# Patient Record
Sex: Female | Born: 1945
Health system: Southern US, Community
[De-identification: ages and names within clinical notes are randomized; demographics above are authoritative.]

## PROBLEM LIST (undated history)

## (undated) DIAGNOSIS — C801 Malignant (primary) neoplasm, unspecified: Secondary | ICD-10-CM

## (undated) DIAGNOSIS — F419 Anxiety disorder, unspecified: Secondary | ICD-10-CM

## (undated) DIAGNOSIS — F32A Depression, unspecified: Secondary | ICD-10-CM

## (undated) HISTORY — PX: CHOLECYSTECTOMY: SHX55

## (undated) HISTORY — PX: BREAST SURGERY: SHX581

## (undated) HISTORY — PX: ABDOMINAL HYSTERECTOMY: SHX81

## (undated) HISTORY — PX: HERNIA REPAIR: SHX51

## (undated) HISTORY — PX: APPENDECTOMY: SHX54

## (undated) HISTORY — DX: Malignant (primary) neoplasm, unspecified: C80.1

---

## 2005-03-01 ENCOUNTER — Encounter: Admission: RE | Admit: 2005-03-01 | Discharge: 2005-03-01 | Payer: Self-pay | Admitting: General Surgery

## 2005-04-09 ENCOUNTER — Ambulatory Visit (HOSPITAL_COMMUNITY): Admission: RE | Admit: 2005-04-09 | Discharge: 2005-04-09 | Payer: Self-pay | Admitting: Oncology

## 2005-04-09 ENCOUNTER — Ambulatory Visit: Payer: Self-pay | Admitting: Cardiology

## 2005-06-26 ENCOUNTER — Ambulatory Visit: Admission: RE | Admit: 2005-06-26 | Discharge: 2005-09-24 | Payer: Self-pay | Admitting: *Deleted

## 2006-11-12 ENCOUNTER — Encounter: Admission: RE | Admit: 2006-11-12 | Discharge: 2006-11-12 | Payer: Self-pay | Admitting: Hematology and Oncology

## 2007-05-06 ENCOUNTER — Encounter (INDEPENDENT_AMBULATORY_CARE_PROVIDER_SITE_OTHER): Payer: Self-pay | Admitting: Radiology

## 2007-05-06 ENCOUNTER — Encounter: Admission: RE | Admit: 2007-05-06 | Discharge: 2007-05-06 | Payer: Self-pay | Admitting: General Surgery

## 2008-04-27 ENCOUNTER — Encounter: Admission: RE | Admit: 2008-04-27 | Discharge: 2008-04-27 | Payer: Self-pay | Admitting: Internal Medicine

## 2008-08-27 HISTORY — PX: CERVICAL DISC SURGERY: SHX588

## 2009-02-04 ENCOUNTER — Encounter: Admission: RE | Admit: 2009-02-04 | Discharge: 2009-02-04 | Payer: Self-pay | Admitting: Neurosurgery

## 2009-04-27 ENCOUNTER — Ambulatory Visit (HOSPITAL_COMMUNITY): Admission: RE | Admit: 2009-04-27 | Discharge: 2009-04-28 | Payer: Self-pay | Admitting: Neurosurgery

## 2009-05-18 ENCOUNTER — Encounter: Admission: RE | Admit: 2009-05-18 | Discharge: 2009-05-18 | Payer: Self-pay | Admitting: Internal Medicine

## 2010-09-17 ENCOUNTER — Encounter (HOSPITAL_COMMUNITY): Payer: Self-pay | Admitting: Internal Medicine

## 2010-12-02 LAB — CBC
HCT: 43.1 % (ref 36.0–46.0)
Hemoglobin: 14.7 g/dL (ref 12.0–15.0)
MCHC: 34 g/dL (ref 30.0–36.0)
MCV: 86.1 fL (ref 78.0–100.0)
Platelets: 249 10*3/uL (ref 150–400)
RBC: 5.01 MIL/uL (ref 3.87–5.11)
RDW: 14.2 % (ref 11.5–15.5)
WBC: 4.2 10*3/uL (ref 4.0–10.5)

## 2011-01-09 NOTE — Op Note (Signed)
NAMEVIANNE, GRIESHOP                  ACCOUNT NO.:  [id number]   MEDICAL RECORD NO.:  [id number]          PATIENT TYPE:  OIB   LOCATION:  3534                         FACILITY:  [facility]   PHYSICIAN:  Hewitt Shorts, M.D.DATE OF BIRTH:  1946/03/03   DATE OF PROCEDURE:  04/27/2009  DATE OF DISCHARGE:                               OPERATIVE REPORT   PREOPERATIVE DIAGNOSES:  Cervical spondylitic disk herniation, C5-C6 and  C6-C7.  Cervical degenerative disk disease and cervical radiculopathy.   POSTOPERATIVE DIAGNOSES:  Cervical spondylitic disk herniation, C5-C6  and C6-C7.  Cervical degenerative disk disease and cervical  radiculopathy.   PROCEDURE:  C5-C6 and C6-C7 anterior cervical decompression and  arthrodesis with allograft and Tether cervical plating.   SURGEON:  Hewitt Shorts, MD   ASSISTANTS:  1. Russell L. Webb Silversmith, NP  2. Stefani Dama, MD   ANESTHESIA:  General endotracheal.   INDICATIONS:  The patient is a 65 year old woman who presented with left  cervical radiculopathy.  She had spondylitic disk herniations to the  left at both C5-C6 and C6-C7 and presented with left cervical  radiculopathy.  A decision was made to proceed with decompression and  arthrodesis.   PROCEDURE IN DETAIL:  The patient was brought to the operating room and  placed under general endotracheal anesthesia.  The patient was placed in  10 pounds of Halter traction.  The neck was prepped with Betadine soap  and solution, and draped in sterile fashion.  A horizontal incision was  made on the left side of the neck.  The line of the incision was  infiltrated with local anesthetic with epinephrine.  Dissection was  carried down through the subcutaneous tissue and platysma.  Bipolar  cautery was used to maintain hemostasis.  Dissection was then carried  out through the avascular plane  leaving the sternocleidomastoid,  carotid artery, and jugular vein laterally and the trachea and esophagus  medially.  The ventral aspect of the vertebral column was identified, a  localized x-ray taken, and the C5-C6 and C6-C7 intervertebral disk space  was identified.  The operating microscope was draped and brought into  the field to provide magnification, illumination, and visualization and  decompression performed using microdissection and microsurgical  technique.  The annulus was incised at each level and anterior  spondylitic overgrowth was removed using Kerrison punches and the  osteophyte removed.  We entered into the disk space and proceeded with  thorough diskectomy removing all loose fragments of disk material.  The  cartilaginous endplates were removed using microcurettes along with the  high-speed drill and then dissection was carried out posteriorly through  the disk space removing posterior osteophytic overgrowth at each level.  We then carefully removed the posterior longitudinal ligament and  decompressed the spinal canal and thecal sac at each level.  We then  turned our attention to the neural foramen.  There was notable  spondylitic disk herniation particularly through her left side  encroaching on the neural foramen and exiting nerve roots.  This was  carefully removed with a 2-mm Kerrison punch decompressing the  exiting  nerve roots.  This was done bilaterally and once decompression was  completed, hemostasis established with use of Gelfoam soaked in thrombin  and then we proceeded with the arthrodesis.   The intervertebral disk space was sized and we selected two 6-mm  allograft implants.  They were hydrated in saline solution and  positioned in the intervertebral space and countersunk.  Once both  grafts were in place, we selected 29-mm Tether cervical plate.  It was  positioned over the fusion construct and secured to the vertebra with 4  x 14 mm variable angle screws, placing a pair screws at C5, another pair  at C7, and a single screw at C6.  Each screw hole was  drilled and the  screws were placed in the alternating fashion.  Once all 5 screws were  in place, the final tightening was performed.  The wound was irrigated  with bacitracin solution, checked for hemostasis which was established  and confirmed.  An x-ray was taken which showed the plate, grafts, and  screws were all in good position.  The alignment was good and then once  hemostasis was reconfirmed, we proceeded with closure.  The platysma was  closed with interrupted inverted 2-0 undyed Vicryl sutures.  Subcutaneous and subcuticular were closed with interrupted inverted 3-0  undyed Vicryl sutures.  The skin was reapproximated with Dermabond.  The  procedure was tolerated well.  The estimated blood loss was 20 mL.  Sponge and needle count was correct.  Following surgery, the patient was  taken out of traction, reversed from the anesthetic, and placed in a  soft cervical collar to be extubated and transferred to the recovery  room for further care.      Hewitt Shorts, M.D.  Electronically Signed     RWN/MEDQ  D:  04/27/2009  T:  04/28/2009  Job:  [id number]

## 2011-02-13 ENCOUNTER — Other Ambulatory Visit: Payer: Self-pay | Admitting: Gastroenterology

## 2011-02-27 ENCOUNTER — Telehealth (INDEPENDENT_AMBULATORY_CARE_PROVIDER_SITE_OTHER): Payer: Self-pay | Admitting: Surgery

## 2011-03-05 ENCOUNTER — Encounter (INDEPENDENT_AMBULATORY_CARE_PROVIDER_SITE_OTHER): Payer: Self-pay | Admitting: Surgery

## 2011-03-08 ENCOUNTER — Encounter (INDEPENDENT_AMBULATORY_CARE_PROVIDER_SITE_OTHER): Payer: Self-pay | Admitting: Surgery

## 2011-03-13 ENCOUNTER — Ambulatory Visit (INDEPENDENT_AMBULATORY_CARE_PROVIDER_SITE_OTHER): Payer: Medicare Other | Admitting: Surgery

## 2011-03-13 ENCOUNTER — Encounter (INDEPENDENT_AMBULATORY_CARE_PROVIDER_SITE_OTHER): Payer: Self-pay | Admitting: Surgery

## 2011-03-13 DIAGNOSIS — K439 Ventral hernia without obstruction or gangrene: Secondary | ICD-10-CM

## 2011-03-13 NOTE — Progress Notes (Signed)
The patient returns after her recent colonoscopy by Dr. Dulce Sellar. He found some large diverticula in the descending and sigmoid colon. A small benign polyp was found in the descending colon. Otherwise the colon was normal. EGD was also unremarkable. He found no explanation for her abdominal pain other than the ventral hernias. She returns today to discuss ventral hernia repair.  The patient has multiple Swiss cheese type defects in her anterior abdominal wall. We will plan a laparoscopic ventral hernia repair with mesh.  The surgical procedure has been discussed with the patient.  Potential risks, benefits, alternative treatments, and expected outcomes have been explained.  All of the patient's questions at this time have been answered.  The patient understand the proposed surgical procedure and wishes to proceed. We will schedule this at the next available date.

## 2011-03-13 NOTE — Patient Instructions (Signed)
Schedule surgery today. 

## 2011-03-16 ENCOUNTER — Encounter (HOSPITAL_COMMUNITY): Payer: Medicare Other

## 2011-03-16 ENCOUNTER — Other Ambulatory Visit (INDEPENDENT_AMBULATORY_CARE_PROVIDER_SITE_OTHER): Payer: Self-pay | Admitting: Surgery

## 2011-03-16 ENCOUNTER — Other Ambulatory Visit: Payer: Self-pay | Admitting: Anesthesiology

## 2011-03-16 LAB — CBC
HCT: 45.2 % (ref 36.0–46.0)
Hemoglobin: 15 g/dL (ref 12.0–15.0)
MCH: 28.5 pg (ref 26.0–34.0)
MCHC: 33.2 g/dL (ref 30.0–36.0)
MCV: 85.9 fL (ref 78.0–100.0)
Platelets: 233 10*3/uL (ref 150–400)
RBC: 5.26 MIL/uL — ABNORMAL HIGH (ref 3.87–5.11)
RDW: 13.6 % (ref 11.5–15.5)
WBC: 4.4 10*3/uL (ref 4.0–10.5)

## 2011-03-16 LAB — SURGICAL PCR SCREEN
MRSA, PCR: NEGATIVE
Staphylococcus aureus: NEGATIVE

## 2011-03-23 ENCOUNTER — Ambulatory Visit (HOSPITAL_COMMUNITY)
Admission: RE | Admit: 2011-03-23 | Discharge: 2011-03-24 | Disposition: A | Discharge status: Home / Self Care | Payer: Medicare Other | Source: Ambulatory Visit | Attending: Surgery | Admitting: Surgery

## 2011-03-23 ENCOUNTER — Other Ambulatory Visit (INDEPENDENT_AMBULATORY_CARE_PROVIDER_SITE_OTHER): Payer: Self-pay | Admitting: Surgery

## 2011-03-23 DIAGNOSIS — Z981 Arthrodesis status: Secondary | ICD-10-CM | POA: Insufficient documentation

## 2011-03-23 DIAGNOSIS — K439 Ventral hernia without obstruction or gangrene: Secondary | ICD-10-CM | POA: Insufficient documentation

## 2011-03-23 DIAGNOSIS — K219 Gastro-esophageal reflux disease without esophagitis: Secondary | ICD-10-CM | POA: Insufficient documentation

## 2011-03-23 DIAGNOSIS — Z79899 Other long term (current) drug therapy: Secondary | ICD-10-CM | POA: Insufficient documentation

## 2011-03-23 DIAGNOSIS — Z853 Personal history of malignant neoplasm of breast: Secondary | ICD-10-CM | POA: Insufficient documentation

## 2011-03-23 DIAGNOSIS — Z01812 Encounter for preprocedural laboratory examination: Secondary | ICD-10-CM | POA: Insufficient documentation

## 2011-03-26 NOTE — Op Note (Signed)
NAMEAURIELLA, Melanie David                  ACCOUNT NO.:  [id number]  MEDICAL RECORD NO.:  [id number]  LOCATION:  DAYL                         FACILITY:  [facility]  PHYSICIAN:  Wilmon Arms. Corliss Skains, M.D. DATE OF BIRTH:  1945/11/30  DATE OF PROCEDURE:  03/23/2011 DATE OF DISCHARGE:                              OPERATIVE REPORT   PREOPERATIVE DIAGNOSIS:  Ventral hernias.  POSTOPERATIVE DIAGNOSIS:  Ventral hernias.  PROCEDURE:  Laparoscopic ventral hernia repair with mesh.  SURGEON:  Wilmon Arms. Coleson Kant, M.D.  ANESTHESIA:  General.  INDICATIONS:  This is a 65 year old female who presents with some epigastric abdominal pain as well as some left lower quadrant abdominal pain.  A CT scan showed at least two small hernias in the anterior abdominal wall, containing only fat.  She underwent a colonoscopy which showed no abnormalities in her colon that were explained any pain.  She presents now for hernia repair.  DESCRIPTION OF PROCEDURE:  The patient was brought to the operating room, placed in the supine position on operating room table.  After an adequate level of general anesthesia was obtained, a Foley catheter was placed under sterile technique.  The patient's abdomen was prepped with ChloraPrep and draped in sterile fashion.  Time-out was taken to ensure proper patient, proper procedure.  We made a 5-mm incision in the left upper quadrant below the costal margin.  A 5-mm OptiVu trocar was slowly advanced into the peritoneal cavity.  We insufflated CO2, maintaining maximal pressure of 15 mmHg.  The angled laparoscope was inserted, and patient was tilted to her right.  Two hernias were identified; one at her umbilicus, one at her epigastrium, just at the end of her falciform ligament.  We placed another 5-mm port in the left lower quadrant and 10- mm port in the left anterior axillary line at the level of the umbilicus.  The harmonic scalpel was used to take down some of the adhesions to anterior  abdominal wall and reduced the lot of omentum out of the hernias.  We dissected some of the falciform ligament, free of the anterior abdominal wall superiorly.  Once we had completely cleared the fascia, we noted about 3 small hernia defects near the umbilicus and a single 2-cm defect in the epigastrium.  We measured the outline of these hernia defects using the spinal needle.  We then selected a 15 x 20 cm sheet of Physiomesh.  We placed 6 stay sutures of 0 Novafil.  The mesh was rolled up and inserted in the peritoneal cavity.  The Endoclose device was used to pull up the sutures through small stab incisions. This deployed the mesh nicely to cover the hernia defects.  We tied down the stay sutures.  One of the stay sutures broke and we had to place an additional one using the Endoclose device.  We then used the secure strap device to secure the edge of the mesh circumferentially at 1 cm intervals.  Hemostasis was good.  We had good coverage of the mesh over the hernia defects.  We removed the 11-mm port and closed the fascial defect with a 0 Vicryl.  Pneumoperitoneum was then released as we removed the  trocars.  A 4-0 Monocryl was used to close the skin incisions.  We sealed up all the incisions with Dermabond.  The patient was then extubated and brought to the recovery in stable condition.  All sponge, instrument, and needle counts correct.     Wilmon Arms. Corliss Skains, M.D.     MKT/MEDQ  D:  03/23/2011  T:  03/23/2011  Job:  [id number]  Electronically Signed by Manus Rudd M.D. on 03/26/2011 10:22:03 AM

## 2011-04-04 ENCOUNTER — Encounter (INDEPENDENT_AMBULATORY_CARE_PROVIDER_SITE_OTHER): Payer: Self-pay | Admitting: Surgery

## 2011-04-09 ENCOUNTER — Ambulatory Visit (INDEPENDENT_AMBULATORY_CARE_PROVIDER_SITE_OTHER): Payer: Medicare Other | Admitting: Surgery

## 2011-04-09 ENCOUNTER — Encounter (INDEPENDENT_AMBULATORY_CARE_PROVIDER_SITE_OTHER): Payer: Medicare Other | Admitting: Surgery

## 2011-04-09 ENCOUNTER — Encounter (INDEPENDENT_AMBULATORY_CARE_PROVIDER_SITE_OTHER): Payer: Self-pay | Admitting: Surgery

## 2011-04-09 DIAGNOSIS — K439 Ventral hernia without obstruction or gangrene: Secondary | ICD-10-CM

## 2011-04-09 NOTE — Progress Notes (Signed)
Status post laparoscopic ventral hernia repair with mesh for two hernia defects. She had one in her epigastrium and one at her umbilicus. She is doing quite well with minimal soreness. She is using an abdominal binder with good results. She returned to work today. Appetite has returned to normal. She is having regular bowel movements. Her incisions all seem to be healing well with no sign of infection. No sign of recurrent hernia. She should continue limiting her activity for another to 3 weeks and then resume full activity. Followup on a p.r.n. basis.

## 2012-09-03 ENCOUNTER — Encounter (HOSPITAL_COMMUNITY): Payer: Self-pay | Admitting: Pharmacy Technician

## 2012-09-15 NOTE — Patient Instructions (Addendum)
Your procedure is scheduled on: 09/22/2012  Report to [facility] at   630     AM.  Call this number if you have problems the morning of surgery: [phone]   Do not eat food or drink liquids :After Midnight.      Take these medicines the morning of surgery with A SIP OF WATER: none   Do not wear jewelry, make-up or nail polish.  Do not wear lotions, powders, or perfumes. You may wear deodorant.  Do not shave 48 hours prior to surgery.  Do not bring valuables to the hospital.  Contacts, dentures or bridgework may not be worn into surgery.  Leave suitcase in the car. After surgery it may be brought to your room.  For patients admitted to the hospital, checkout time is 11:00 AM the day of discharge.   Patients discharged the day of surgery will not be allowed to drive home.  :     Please read over the following fact sheets that you were given: Coughing and Deep Breathing, Surgical Site Infection Prevention, Anesthesia Post-op Instructions and Care and Recovery After Surgery    Cataract A cataract is a clouding of the lens of the eye. When a lens becomes cloudy, vision is reduced based on the degree and nature of the clouding. Many cataracts reduce vision to some degree. Some cataracts make people more near-sighted as they develop. Other cataracts increase glare. Cataracts that are ignored and become worse can sometimes look white. The white color can be seen through the pupil. CAUSES   Aging. However, cataracts may occur at any age, even in newborns.   Certain drugs.   Trauma to the eye.   Certain diseases such as diabetes.   Specific eye diseases such as chronic inflammation inside the eye or a sudden attack of a rare form of glaucoma.   Inherited or acquired medical problems.  SYMPTOMS   Gradual, progressive drop in vision in the affected eye.   Severe, rapid visual loss. This most often happens when trauma is the cause.  DIAGNOSIS  To detect a cataract, an eye doctor  examines the lens. Cataracts are best diagnosed with an exam of the eyes with the pupils enlarged (dilated) by drops.  TREATMENT  For an early cataract, vision may improve by using different eyeglasses or stronger lighting. If that does not help your vision, surgery is the only effective treatment. A cataract needs to be surgically removed when vision loss interferes with your everyday activities, such as driving, reading, or watching TV. A cataract may also have to be removed if it prevents examination or treatment of another eye problem. Surgery removes the cloudy lens and usually replaces it with a substitute lens (intraocular lens, IOL).  At a time when both you and your doctor agree, the cataract will be surgically removed. If you have cataracts in both eyes, only one is usually removed at a time. This allows the operated eye to heal and be out of danger from any possible problems after surgery (such as infection or poor wound healing). In rare cases, a cataract may be doing damage to your eye. In these cases, your caregiver may advise surgical removal right away. The vast majority of people who have cataract surgery have better vision afterward. HOME CARE INSTRUCTIONS  If you are not planning surgery, you may be asked to do the following:  Use different eyeglasses.   Use stronger or brighter lighting.   Ask your eye doctor about reducing  your medicine dose or changing medicines if it is thought that a medicine caused your cataract. Changing medicines does not make the cataract go away on its own.   Become familiar with your surroundings. Poor vision can lead to injury. Avoid bumping into things on the affected side. You are at a higher risk for tripping or falling.   Exercise extreme care when driving or operating machinery.   Wear sunglasses if you are sensitive to bright light or experiencing problems with glare.  SEEK IMMEDIATE MEDICAL CARE IF:   You have a worsening or sudden vision  loss.   You notice redness, swelling, or increasing pain in the eye.   You have a fever.  Document Released: 08/13/2005 Document Revised: 08/02/2011 Document Reviewed: 04/06/2011 [facility] Patient Information 2012 [place].PATIENT INSTRUCTIONS POST-ANESTHESIA  IMMEDIATELY FOLLOWING SURGERY:  Do not drive or operate machinery for the first twenty four hours after surgery.  Do not make any important decisions for twenty four hours after surgery or while taking narcotic pain medications or sedatives.  If you develop intractable nausea and vomiting or a severe headache please notify your doctor immediately.  FOLLOW-UP:  Please make an appointment with your surgeon as instructed. You do not need to follow up with anesthesia unless specifically instructed to do so.  WOUND CARE INSTRUCTIONS (if applicable):  Keep a dry clean dressing on the anesthesia/puncture wound site if there is drainage.  Once the wound has quit draining you may leave it open to air.  Generally you should leave the bandage intact for twenty four hours unless there is drainage.  If the epidural site drains for more than 36-48 hours please call the anesthesia department.  QUESTIONS?:  Please feel free to call your physician or the hospital operator if you have any questions, and they will be happy to assist you.

## 2012-09-16 ENCOUNTER — Encounter (HOSPITAL_COMMUNITY): Payer: Self-pay

## 2012-09-16 ENCOUNTER — Encounter (HOSPITAL_COMMUNITY)
Admission: RE | Admit: 2012-09-16 | Discharge: 2012-09-16 | Disposition: A | Discharge status: Home / Self Care | Payer: Medicare Other | Source: Ambulatory Visit | Attending: Ophthalmology | Admitting: Ophthalmology

## 2012-09-16 LAB — BASIC METABOLIC PANEL
BUN: 13 mg/dL (ref 6–23)
CO2: 31 mEq/L (ref 19–32)
Calcium: 10.1 mg/dL (ref 8.4–10.5)
Chloride: 100 mEq/L (ref 96–112)
Creatinine, Ser: 0.76 mg/dL (ref 0.50–1.10)
GFR calc Af Amer: >90 mL/min (ref >90)
GFR calc non Af Amer: 85 mL/min — ABNORMAL LOW (ref >90)
Glucose, Bld: 82 mg/dL (ref 70–99)
Potassium: 4.2 mEq/L (ref 3.5–5.1)
Sodium: 139 mEq/L (ref 135–145)

## 2012-09-16 LAB — HEMOGLOBIN AND HEMATOCRIT, BLOOD
HCT: 46.8 % — ABNORMAL HIGH (ref 36.0–46.0)
Hemoglobin: 15.5 g/dL — ABNORMAL HIGH (ref 12.0–15.0)

## 2012-09-19 MED ORDER — LIDOCAINE HCL (PF) 1 % IJ SOLN
INTRAMUSCULAR | Status: AC
  Filled 2012-09-19: qty 2

## 2012-09-19 MED ORDER — TETRACAINE HCL 0.5 % OP SOLN
OPHTHALMIC | Status: AC
  Filled 2012-09-19: qty 2

## 2012-09-19 MED ORDER — CYCLOPENTOLATE-PHENYLEPHRINE 0.2-1 % OP SOLN
OPHTHALMIC | Status: AC
  Filled 2012-09-19: qty 2

## 2012-09-19 MED ORDER — NEOMYCIN-POLYMYXIN-DEXAMETH 3.5-10000-0.1 OP OINT
TOPICAL_OINTMENT | OPHTHALMIC | Status: AC
  Filled 2012-09-19: qty 3.5

## 2012-09-19 MED ORDER — PHENYLEPHRINE HCL 2.5 % OP SOLN
OPHTHALMIC | Status: AC
  Filled 2012-09-19: qty 2

## 2012-09-19 MED ORDER — LIDOCAINE HCL 3.5 % OP GEL
OPHTHALMIC | Status: AC
  Filled 2012-09-19: qty 5

## 2012-09-22 ENCOUNTER — Encounter (HOSPITAL_COMMUNITY): Payer: Self-pay | Admitting: Anesthesiology

## 2012-09-22 ENCOUNTER — Encounter (HOSPITAL_COMMUNITY)
Admission: RE | Disposition: A | Discharge status: Home / Self Care | Payer: Self-pay | Source: Ambulatory Visit | Attending: Ophthalmology

## 2012-09-22 ENCOUNTER — Ambulatory Visit (HOSPITAL_COMMUNITY): Payer: Medicare Other | Admitting: Anesthesiology

## 2012-09-22 ENCOUNTER — Ambulatory Visit (HOSPITAL_COMMUNITY)
Admission: RE | Admit: 2012-09-22 | Discharge: 2012-09-22 | Disposition: A | Discharge status: Home / Self Care | Payer: Medicare Other | Source: Ambulatory Visit | Attending: Ophthalmology | Admitting: Ophthalmology

## 2012-09-22 ENCOUNTER — Encounter (HOSPITAL_COMMUNITY): Payer: Self-pay | Admitting: *Deleted

## 2012-09-22 DIAGNOSIS — Z79899 Other long term (current) drug therapy: Secondary | ICD-10-CM | POA: Insufficient documentation

## 2012-09-22 DIAGNOSIS — Z01812 Encounter for preprocedural laboratory examination: Secondary | ICD-10-CM | POA: Insufficient documentation

## 2012-09-22 DIAGNOSIS — H2510 Age-related nuclear cataract, unspecified eye: Secondary | ICD-10-CM | POA: Insufficient documentation

## 2012-09-22 HISTORY — PX: CATARACT EXTRACTION W/PHACO: SHX586

## 2012-09-22 SURGERY — PHACOEMULSIFICATION, CATARACT, WITH IOL INSERTION
Anesthesia: Monitor Anesthesia Care | Site: Eye | Laterality: Right | Wound class: Clean

## 2012-09-22 MED ORDER — PHENYLEPHRINE HCL 2.5 % OP SOLN
1.0000 [drp] | OPHTHALMIC | Status: AC
  Administered 2012-09-22 (×3): 1 [drp] via OPHTHALMIC

## 2012-09-22 MED ORDER — LIDOCAINE 3.5 % OP GEL OPTIME - NO CHARGE
OPHTHALMIC | Status: DC | PRN
  Administered 2012-09-22: 1 [drp] via OPHTHALMIC

## 2012-09-22 MED ORDER — CYCLOPENTOLATE-PHENYLEPHRINE 0.2-1 % OP SOLN
1.0000 [drp] | OPHTHALMIC | Status: AC
  Administered 2012-09-22 (×3): 1 [drp] via OPHTHALMIC

## 2012-09-22 MED ORDER — MIDAZOLAM HCL 2 MG/2ML IJ SOLN
INTRAMUSCULAR | Status: AC
  Filled 2012-09-22: qty 2

## 2012-09-22 MED ORDER — NEOMYCIN-POLYMYXIN-DEXAMETH 0.1 % OP OINT
TOPICAL_OINTMENT | OPHTHALMIC | Status: DC | PRN
  Administered 2012-09-22: 1 via OPHTHALMIC

## 2012-09-22 MED ORDER — EPINEPHRINE HCL 1 MG/ML IJ SOLN
INTRAOCULAR | Status: DC | PRN
  Administered 2012-09-22: 08:00:00

## 2012-09-22 MED ORDER — MIDAZOLAM HCL 2 MG/2ML IJ SOLN
1.0000 mg | INTRAMUSCULAR | Status: DC | PRN
  Administered 2012-09-22: 2 mg via INTRAVENOUS

## 2012-09-22 MED ORDER — LACTATED RINGERS IV SOLN
INTRAVENOUS | Status: DC
  Administered 2012-09-22: 1000 mL via INTRAVENOUS

## 2012-09-22 MED ORDER — EPINEPHRINE HCL 1 MG/ML IJ SOLN
INTRAMUSCULAR | Status: AC
  Filled 2012-09-22: qty 1

## 2012-09-22 MED ORDER — LIDOCAINE HCL (PF) 1 % IJ SOLN
INTRAMUSCULAR | Status: DC | PRN
  Administered 2012-09-22: .2 mL

## 2012-09-22 MED ORDER — BSS IO SOLN
INTRAOCULAR | Status: DC | PRN
  Administered 2012-09-22: 15 mL via INTRAOCULAR

## 2012-09-22 MED ORDER — PROVISC 10 MG/ML IO SOLN
INTRAOCULAR | Status: DC | PRN
  Administered 2012-09-22: 8.5 mg via INTRAOCULAR

## 2012-09-22 MED ORDER — POVIDONE-IODINE 5 % OP SOLN
OPHTHALMIC | Status: DC | PRN
  Administered 2012-09-22: 1 via OPHTHALMIC

## 2012-09-22 MED ORDER — LIDOCAINE HCL 3.5 % OP GEL
1.0000 "application " | Freq: Once | OPHTHALMIC | Status: AC
  Administered 2012-09-22: 1 via OPHTHALMIC

## 2012-09-22 MED ORDER — TETRACAINE HCL 0.5 % OP SOLN
1.0000 [drp] | OPHTHALMIC | Status: AC
  Administered 2012-09-22 (×3): 1 [drp] via OPHTHALMIC

## 2012-09-22 SURGICAL SUPPLY — 11 items
CLOTH BEACON ORANGE TIMEOUT ST (SAFETY) ×1 IMPLANT
EYE SHIELD UNIVERSAL CLEAR (GAUZE/BANDAGES/DRESSINGS) ×1 IMPLANT
GLOVE BIOGEL PI IND STRL 6.5 (GLOVE) IMPLANT
GLOVE BIOGEL PI INDICATOR 6.5 (GLOVE) ×1
GLOVE EXAM NITRILE MD LF STRL (GLOVE) ×1 IMPLANT
PAD ARMBOARD 7.5X6 YLW CONV (MISCELLANEOUS) ×1 IMPLANT
SIGHTPATH CAT PROC W REG LENS (Ophthalmic Related) ×2 IMPLANT
SYR TB 1ML LL NO SAFETY (SYRINGE) ×1 IMPLANT
TAPE SURG TRANSPORE 1 IN (GAUZE/BANDAGES/DRESSINGS) IMPLANT
TAPE SURGICAL TRANSPORE 1 IN (GAUZE/BANDAGES/DRESSINGS) ×1
WATER STERILE IRR 250ML POUR (IV SOLUTION) ×1 IMPLANT

## 2012-09-22 NOTE — Anesthesia Postprocedure Evaluation (Signed)
  Anesthesia Post-op Note  Patient: Melanie David  Procedure(s) Performed: Procedure(s) (LRB): CATARACT EXTRACTION PHACO AND INTRAOCULAR LENS PLACEMENT (IOC) (Right)  Patient Location:  Short Stay  Anesthesia Type: MAC  Level of Consciousness: awake  Airway and Oxygen Therapy: Patient Spontanous Breathing  Post-op Pain: none  Post-op Assessment: Post-op Vital signs reviewed, Patient's Cardiovascular Status Stable, Respiratory Function Stable, Patent Airway, No signs of Nausea or vomiting and Pain level controlled  Post-op Vital Signs: Reviewed and stable  Complications: No apparent anesthesia complications

## 2012-09-22 NOTE — Anesthesia Procedure Notes (Signed)
Procedure Name: MAC Date/Time: 09/22/2012 8:04 AM Performed by: Franco Nones Pre-anesthesia Checklist: Patient identified, Emergency Drugs available, Suction available, Timeout performed and Patient being monitored Patient Re-evaluated:Patient Re-evaluated prior to inductionOxygen Delivery Method: Nasal Cannula

## 2012-09-22 NOTE — Brief Op Note (Signed)
Pre-Op Dx: Cataract OD Post-Op Dx: Cataract OD Surgeon: Aadit Hagood Anesthesia: Topical with MAC Surgery: Cataract Extraction with Intraocular lens Implant OD Implant: B&L enVista Specimen: None Complications: None 

## 2012-09-22 NOTE — Anesthesia Preprocedure Evaluation (Signed)
Anesthesia Evaluation  Patient identified by MRN, date of birth, ID band Patient awake    Reviewed: Allergy & Precautions, H&P , NPO status , Patient's Chart, lab work & pertinent test results  Airway  TM Distance: >3 FB Neck ROM: Full    Dental  (+) Upper Dentures and Lower Dentures   Pulmonary neg pulmonary ROS,    Pulmonary exam normal       Cardiovascular negative cardio ROS  Rhythm:Regular Rate:Normal     Neuro/Psych negative neurological ROS  negative psych ROS   GI/Hepatic negative GI ROS, Neg liver ROS,   Endo/Other  negative endocrine ROS  Renal/GU negative Renal ROS     Musculoskeletal negative musculoskeletal ROS (+)   Abdominal Normal abdominal exam  (+)   Peds  Hematology negative hematology ROS (+)   Anesthesia Other Findings   Reproductive/Obstetrics                           Anesthesia Physical Anesthesia Plan  ASA: I  Anesthesia Plan: MAC   Post-op Pain Management:    Induction: Intravenous  Airway Management Planned: Nasal Cannula  Additional Equipment:   Intra-op Plan:   Post-operative Plan:   Informed Consent: I have reviewed the patients History and Physical, chart, labs and discussed the procedure including the risks, benefits and alternatives for the proposed anesthesia with the patient or authorized representative who has indicated his/her understanding and acceptance.     Plan Discussed with: CRNA  Anesthesia Plan Comments:         Anesthesia Quick Evaluation

## 2012-09-22 NOTE — Op Note (Signed)
Melanie David, Melanie David                  ACCOUNT NO.:  [id number]  MEDICAL RECORD NO.:  [id number]  LOCATION:  APPO                          FACILITY:  [facility]  PHYSICIAN:  Susanne Greenhouse, MD       DATE OF BIRTH:  [birth year]  DATE OF PROCEDURE: DATE OF DISCHARGE:  09/22/2012                              OPERATIVE REPORT   PREOPERATIVE DIAGNOSIS:  Nuclear cataract, right eye, diagnosis code 366.16.  POSTOPERATIVE DIAGNOSIS:  Nuclear cataract, right eye, diagnosis code 366.16.  PROCEDURE:  Phacoemulsification with intraocular lens implant, right eye.  ANESTHESIA:  Topical with monitored anesthesia care and IV sedation.  OPERATIVE SUMMARY:  In the preoperative area, dilating drops were placed into the right eye.  The patient was then brought into the operating room where she was placed under topical anesthesia and IV sedation.  The eye was then prepped and draped.  Beginning with a 75 blade, a paracentesis port was made at the surgeon's 2 o'clock position.  The anterior chamber was then filled with a 1% nonpreserved lidocaine solution with epinephrine.  This was followed by Viscoat to deepen the chamber.  A small fornix-based peritomy was performed superiorly.  Next, a single iris hook was placed through the limbus superiorly.  A 2.4-mm keratome blade was then used to make a clear corneal incision over the iris hook.  A bent cystotome needle and Utrata forceps were used to create a continuous tear capsulotomy.  Hydrodissection was performed using balanced salt solution on a fine cannula.  The lens nucleus was then removed using phacoemulsification in a quadrant cracking technique. The cortical material was then removed with irrigation and aspiration. The capsular bag and anterior chamber were refilled with Provisc.  The wound was widened to approximately 3 mm and a posterior chamber intraocular lens was placed into the capsular bag without difficulty using an [employer] lens injecting  system.  A single 10-0 nylon suture was then used to close the incision as well as stromal hydration. The Provisc was removed from the anterior chamber and capsular bag with irrigation and aspiration.  At this point, the wounds were tested for leak, which were negative.  The anterior chamber remained deep and stable.  The patient tolerated the procedure well.  There were no operative complications, and she awoke from topical anesthesia and IV sedation without problem.  There were no surgical specimens.  Prosthetic device used is a Bausch and Lomb posterior chamber lens, model enVista, model #MX60, power of 11.0, serial number is [id number].         ______________________________ Susanne Greenhouse, MD    KEH/MEDQ  D:  09/22/2012  T:  09/22/2012  Job:  [id number]

## 2012-09-22 NOTE — H&P (Signed)
I have reviewed the H&P, the patient was re-examined, and I have identified no interval changes in medical condition and plan of care since the history and physical of record  

## 2012-09-22 NOTE — Transfer of Care (Signed)
Immediate Anesthesia Transfer of Care Note  Patient: Melanie David  Procedure(s) Performed: Procedure(s) (LRB): CATARACT EXTRACTION PHACO AND INTRAOCULAR LENS PLACEMENT (IOC) (Right)  Patient Location: Shortstay  Anesthesia Type: MAC  Level of Consciousness: awake  Airway & Oxygen Therapy: Patient Spontanous Breathing   Post-op Assessment: Report given to PACU RN, Post -op Vital signs reviewed and stable and Patient moving all extremities  Post vital signs: Reviewed and stable  Complications: No apparent anesthesia complications

## 2012-09-23 ENCOUNTER — Encounter (HOSPITAL_COMMUNITY): Payer: Self-pay | Admitting: Ophthalmology

## 2012-09-29 ENCOUNTER — Encounter (HOSPITAL_COMMUNITY): Payer: Self-pay | Admitting: Pharmacy Technician

## 2012-10-02 ENCOUNTER — Encounter (HOSPITAL_COMMUNITY)
Admission: RE | Admit: 2012-10-02 | Discharge: 2012-10-02 | Disposition: A | Discharge status: Home / Self Care | Payer: Medicare Other | Source: Ambulatory Visit | Attending: Ophthalmology | Admitting: Ophthalmology

## 2012-10-02 ENCOUNTER — Encounter (HOSPITAL_COMMUNITY): Payer: Self-pay

## 2012-10-03 MED ORDER — PHENYLEPHRINE HCL 2.5 % OP SOLN
OPHTHALMIC | Status: AC
  Filled 2012-10-03: qty 2

## 2012-10-03 MED ORDER — LIDOCAINE HCL (PF) 1 % IJ SOLN
INTRAMUSCULAR | Status: AC
  Filled 2012-10-03: qty 2

## 2012-10-03 MED ORDER — NEOMYCIN-POLYMYXIN-DEXAMETH 3.5-10000-0.1 OP OINT
TOPICAL_OINTMENT | OPHTHALMIC | Status: AC
  Filled 2012-10-03: qty 3.5

## 2012-10-03 MED ORDER — ONDANSETRON HCL 4 MG/2ML IJ SOLN: 4.0000 mg | Freq: Once | INTRAMUSCULAR | Status: AC | PRN

## 2012-10-03 MED ORDER — CYCLOPENTOLATE-PHENYLEPHRINE 0.2-1 % OP SOLN
OPHTHALMIC | Status: AC
  Filled 2012-10-03: qty 2

## 2012-10-03 MED ORDER — TETRACAINE HCL 0.5 % OP SOLN
OPHTHALMIC | Status: AC
  Filled 2012-10-03: qty 2

## 2012-10-03 MED ORDER — LIDOCAINE HCL 3.5 % OP GEL
OPHTHALMIC | Status: AC
  Filled 2012-10-03: qty 5

## 2012-10-03 MED ORDER — FENTANYL CITRATE 0.05 MG/ML IJ SOLN: 25.0000 ug | INTRAMUSCULAR | Status: DC | PRN

## 2012-10-06 ENCOUNTER — Encounter (HOSPITAL_COMMUNITY)
Admission: RE | Disposition: A | Discharge status: Home / Self Care | Payer: Self-pay | Source: Ambulatory Visit | Attending: Ophthalmology

## 2012-10-06 ENCOUNTER — Ambulatory Visit (HOSPITAL_COMMUNITY): Payer: Medicare Other | Admitting: Anesthesiology

## 2012-10-06 ENCOUNTER — Encounter (HOSPITAL_COMMUNITY): Payer: Self-pay | Admitting: Anesthesiology

## 2012-10-06 ENCOUNTER — Ambulatory Visit (HOSPITAL_COMMUNITY)
Admission: RE | Admit: 2012-10-06 | Discharge: 2012-10-06 | Disposition: A | Discharge status: Home / Self Care | Payer: Medicare Other | Source: Ambulatory Visit | Attending: Ophthalmology | Admitting: Ophthalmology

## 2012-10-06 ENCOUNTER — Encounter (HOSPITAL_COMMUNITY): Payer: Self-pay | Admitting: *Deleted

## 2012-10-06 DIAGNOSIS — H2510 Age-related nuclear cataract, unspecified eye: Secondary | ICD-10-CM | POA: Insufficient documentation

## 2012-10-06 HISTORY — PX: CATARACT EXTRACTION W/PHACO: SHX586

## 2012-10-06 SURGERY — PHACOEMULSIFICATION, CATARACT, WITH IOL INSERTION
Anesthesia: Monitor Anesthesia Care | Site: Eye | Laterality: Left | Wound class: Clean

## 2012-10-06 MED ORDER — LACTATED RINGERS IV SOLN
INTRAVENOUS | Status: DC
  Administered 2012-10-06: 10:00:00 via INTRAVENOUS

## 2012-10-06 MED ORDER — LIDOCAINE HCL (PF) 1 % IJ SOLN
INTRAMUSCULAR | Status: DC | PRN
  Administered 2012-10-06: .3 mL

## 2012-10-06 MED ORDER — LIDOCAINE HCL 3.5 % OP GEL
1.0000 "application " | Freq: Once | OPHTHALMIC | Status: AC
  Administered 2012-10-06: 1 via OPHTHALMIC

## 2012-10-06 MED ORDER — NEOMYCIN-POLYMYXIN-DEXAMETH 0.1 % OP OINT
TOPICAL_OINTMENT | OPHTHALMIC | Status: DC | PRN
  Administered 2012-10-06: 1 via OPHTHALMIC

## 2012-10-06 MED ORDER — PROVISC 10 MG/ML IO SOLN
INTRAOCULAR | Status: DC | PRN
  Administered 2012-10-06: 8.5 mg via INTRAOCULAR

## 2012-10-06 MED ORDER — POVIDONE-IODINE 5 % OP SOLN
OPHTHALMIC | Status: DC | PRN
  Administered 2012-10-06: 1 via OPHTHALMIC

## 2012-10-06 MED ORDER — BSS IO SOLN
INTRAOCULAR | Status: DC | PRN
  Administered 2012-10-06: 15 mL via INTRAOCULAR

## 2012-10-06 MED ORDER — MIDAZOLAM HCL 2 MG/2ML IJ SOLN
INTRAMUSCULAR | Status: AC
  Filled 2012-10-06: qty 2

## 2012-10-06 MED ORDER — EPINEPHRINE HCL 1 MG/ML IJ SOLN
INTRAOCULAR | Status: DC | PRN
  Administered 2012-10-06: 11:00:00

## 2012-10-06 MED ORDER — TETRACAINE HCL 0.5 % OP SOLN
1.0000 [drp] | OPHTHALMIC | Status: AC
  Administered 2012-10-06 (×3): 1 [drp] via OPHTHALMIC

## 2012-10-06 MED ORDER — EPINEPHRINE HCL 1 MG/ML IJ SOLN
INTRAMUSCULAR | Status: AC
  Filled 2012-10-06: qty 1

## 2012-10-06 MED ORDER — MIDAZOLAM HCL 2 MG/2ML IJ SOLN
1.0000 mg | INTRAMUSCULAR | Status: DC | PRN
  Administered 2012-10-06: 2 mg via INTRAVENOUS

## 2012-10-06 MED ORDER — CYCLOPENTOLATE-PHENYLEPHRINE 0.2-1 % OP SOLN
1.0000 [drp] | OPHTHALMIC | Status: AC
  Administered 2012-10-06 (×3): 1 [drp] via OPHTHALMIC

## 2012-10-06 MED ORDER — PHENYLEPHRINE HCL 2.5 % OP SOLN
1.0000 [drp] | OPHTHALMIC | Status: AC
  Administered 2012-10-06 (×3): 1 [drp] via OPHTHALMIC

## 2012-10-06 MED ORDER — LIDOCAINE 3.5 % OP GEL OPTIME - NO CHARGE
OPHTHALMIC | Status: DC | PRN
  Administered 2012-10-06: 1 [drp] via OPHTHALMIC

## 2012-10-06 SURGICAL SUPPLY — 32 items

## 2012-10-06 NOTE — Op Note (Signed)
Melanie David, Melanie David                  ACCOUNT NO.:  [id number]  MEDICAL RECORD NO.:  [id number]  LOCATION:  [facility]                          FACILITY:  [facility]  PHYSICIAN:  Susanne Greenhouse, MD       DATE OF BIRTH:  1946-08-17  DATE OF PROCEDURE:  10/06/2012 DATE OF DISCHARGE:  10/06/2012                              OPERATIVE REPORT   PREOPERATIVE DIAGNOSIS:  Combined cataract, left eye, diagnosis code 366.19.  POSTOPERATIVE DIAGNOSIS:  Combined cataract, left eye, diagnosis code 366.19.  OPERATION PERFORMED:  Phacoemulsification with posterior chamber intraocular lens implantation, left eye.  SURGEON:  Bonne Dolores. Joushua Dugar, MD  ANESTHESIA:  Topical with monitored anesthesia care and IV sedation.  OPERATIVE SUMMARY:  In the preoperative area, dilating drops were placed into the left eye.  The patient was then brought into the operating room where she was placed under general anesthesia.  The eye was then prepped and draped.  Beginning with a 75 blade, a paracentesis port was made at the surgeon's 2 o'clock position.  The anterior chamber was then filled with a 1% nonpreserved lidocaine solution with epinephrine.  This was followed by Viscoat to deepen the chamber.  A small fornix-based peritomy was performed superiorly.  Next, a single iris hook was placed through the limbus superiorly.  A 2.4-mm keratome blade was then used to make a clear corneal incision over the iris hook.  A bent cystotome needle and Utrata forceps were used to create a continuous tear capsulotomy.  Hydrodissection was performed using balanced salt solution on a fine cannula.  The lens nucleus was then removed using phacoemulsification in a quadrant cracking technique.  The cortical material was then removed with irrigation and aspiration.  The capsular bag and anterior chamber were refilled with Provisc.  The wound was widened to approximately 3 mm and a posterior chamber intraocular lens was placed into the capsular bag  without difficulty using an [employer] lens injecting system.  A single 10-0 nylon suture was then used to close the incision as well as stromal hydration.  The Provisc was removed from the anterior chamber and capsular bag with irrigation and aspiration.  At this point, the wounds were tested for leak, which were negative.  The anterior chamber remained deep and stable.  The patient tolerated the procedure well.  There were no operative complications, and she awoke from general anesthesia without problem.  No surgical specimens.  Prosthetic device used is a Bausch and Lomb posterior intraocular lens, model MX60, power of 14.0, serial number is [id number].          ______________________________ Susanne Greenhouse, MD     KEH/MEDQ  D:  10/06/2012  T:  10/06/2012  Job:  [id number]

## 2012-10-06 NOTE — Transfer of Care (Signed)
Immediate Anesthesia Transfer of Care Note  Patient: Melanie David  Procedure(s) Performed: Procedure(s) with comments: CATARACT EXTRACTION PHACO AND INTRAOCULAR LENS PLACEMENT (IOC) (Left) - CDE: 14.58  Patient Location: Short Stay  Anesthesia Type:MAC  Level of Consciousness: awake, alert , oriented and patient cooperative  Airway & Oxygen Therapy: Patient Spontanous Breathing  Post-op Assessment: Report given to PACU RN, Post -op Vital signs reviewed and stable and Patient moving all extremities  Post vital signs: Reviewed and stable  Complications: No apparent anesthesia complications

## 2012-10-06 NOTE — Anesthesia Preprocedure Evaluation (Signed)
Anesthesia Evaluation  Patient identified by MRN, date of birth, ID band Patient awake    Reviewed: Allergy & Precautions, H&P , NPO status , Patient's Chart, lab work & pertinent test results  Airway Mallampati: I TM Distance: >3 FB Neck ROM: Full    Dental  (+) Upper Dentures and Lower Dentures   Pulmonary neg pulmonary ROS,    Pulmonary exam normal       Cardiovascular negative cardio ROS  Rhythm:Regular Rate:Normal     Neuro/Psych negative neurological ROS  negative psych ROS   GI/Hepatic negative GI ROS, Neg liver ROS,   Endo/Other  negative endocrine ROS  Renal/GU negative Renal ROS     Musculoskeletal negative musculoskeletal ROS (+)   Abdominal Normal abdominal exam  (+)   Peds  Hematology negative hematology ROS (+)   Anesthesia Other Findings   Reproductive/Obstetrics                           Anesthesia Physical Anesthesia Plan  ASA: I  Anesthesia Plan: MAC   Post-op Pain Management:    Induction: Intravenous  Airway Management Planned: Nasal Cannula  Additional Equipment:   Intra-op Plan:   Post-operative Plan:   Informed Consent: I have reviewed the patients History and Physical, chart, labs and discussed the procedure including the risks, benefits and alternatives for the proposed anesthesia with the patient or authorized representative who has indicated his/her understanding and acceptance.     Plan Discussed with: CRNA  Anesthesia Plan Comments:         Anesthesia Quick Evaluation

## 2012-10-06 NOTE — Brief Op Note (Signed)
Pre-Op Dx: Cataract OS Post-Op Dx: Cataract OS Surgeon: Teia Freitas Anesthesia: Topical with MAC Surgery: Cataract Extraction with Intraocular lens Implant OS Implant: B&L enVista Specimen: None Complications: None 

## 2012-10-06 NOTE — H&P (Signed)
I have reviewed the H&P, the patient was re-examined, and I have identified no interval changes in medical condition and plan of care since the history and physical of record  

## 2012-10-06 NOTE — Anesthesia Postprocedure Evaluation (Signed)
  Anesthesia Post-op Note  Patient: Melanie David  Procedure(s) Performed: Procedure(s) with comments: CATARACT EXTRACTION PHACO AND INTRAOCULAR LENS PLACEMENT (IOC) (Left) - CDE: 14.58  Patient Location: Short Stay  Anesthesia Type:MAC  Level of Consciousness: awake, alert , oriented and patient cooperative  Airway and Oxygen Therapy: Patient Spontanous Breathing  Post-op Pain: none  Post-op Assessment: Post-op Vital signs reviewed, Patient's Cardiovascular Status Stable, Respiratory Function Stable, Patent Airway, No signs of Nausea or vomiting and Pain level controlled  Post-op Vital Signs: Reviewed and stable  Complications: No apparent anesthesia complications

## 2012-10-07 ENCOUNTER — Encounter (HOSPITAL_COMMUNITY): Payer: Self-pay | Admitting: Ophthalmology

## 2015-07-14 ENCOUNTER — Encounter (INDEPENDENT_AMBULATORY_CARE_PROVIDER_SITE_OTHER): Payer: Self-pay | Admitting: *Deleted

## 2015-08-09 ENCOUNTER — Encounter (INDEPENDENT_AMBULATORY_CARE_PROVIDER_SITE_OTHER): Payer: Self-pay | Admitting: *Deleted

## 2015-08-10 ENCOUNTER — Ambulatory Visit (INDEPENDENT_AMBULATORY_CARE_PROVIDER_SITE_OTHER): Payer: Medicare Other | Admitting: Internal Medicine

## 2015-08-10 ENCOUNTER — Other Ambulatory Visit (INDEPENDENT_AMBULATORY_CARE_PROVIDER_SITE_OTHER): Payer: Self-pay | Admitting: Internal Medicine

## 2015-08-10 ENCOUNTER — Telehealth (INDEPENDENT_AMBULATORY_CARE_PROVIDER_SITE_OTHER): Payer: Self-pay | Admitting: *Deleted

## 2015-08-10 ENCOUNTER — Encounter (INDEPENDENT_AMBULATORY_CARE_PROVIDER_SITE_OTHER): Payer: Self-pay | Admitting: Internal Medicine

## 2015-08-10 VITALS — BP 100/70 | HR 72 | Temp 98.1°F | Ht 59.5 in | Wt 145.3 lb

## 2015-08-10 DIAGNOSIS — R195 Other fecal abnormalities: Secondary | ICD-10-CM

## 2015-08-10 DIAGNOSIS — Z1211 Encounter for screening for malignant neoplasm of colon: Secondary | ICD-10-CM

## 2015-08-10 DIAGNOSIS — Z800 Family history of malignant neoplasm of digestive organs: Secondary | ICD-10-CM

## 2015-08-10 NOTE — Progress Notes (Addendum)
Subjective:    Patient ID: Melanie David, female    DOB: Aug 24, 1946, 69 y.o.   MRN: [id number]  HPI Referred by Dr. Manuella Ghazi for positive stool card (1 out of 3). She says she has not seen any blood in her stools. She tells me she had a grandfather in his 43 s with colon cancer, and a brother with colon cancer. It had metastased from his liver to his colon. She had a  colonoscopy was in 2008 by Dr. Ferdinand Lango in [facility] (Hyperplastic polyp, internal hemorrhoids, eventually banding). Her last colonoscopy was in 2012 by Dr. Paulita Fujita and she tells me it was normal.  There has been no weight loss. Appetite is good. No abdominal pain.   She is retired from [employer].  02/13/2011 Colonoscopy:/EGD: Dr. Paulita Fujita. Change i bowel habits, diarrhea EGD: Normal esophagus without evidence of inflammation, stricture,mass, varices or Barrett's epithelium. Normal stomach without inflammation, mass, ulcer, AVM or other pathology. 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  Colonoscopy: Perianal exam normal. Multiple large mouthed diverticula were found in the sigmoid colon and in the descending colon. A sessile polyp was found in the descending colon. The polyp was 4 mm in size. The polyp was removed with a cold biopsy forceps. The colon (entire examined portion) appeared normal.  Colon was otherwise normal without polyps, masses, vascular ectasias, or inflammatory changes. The retroflexed view of the distal rectum and anal verge showed internal hemorrhoids but otherwise normal and showed no other anal or rectal abnormalities. Biopsy: Duodenal biopsy: Small bowel mucosa with gastric heterotopia. No evidence of malignancy. Random colon biopsy: Benign colonic mucosa. No significant inflammation or other abnormalities identified. Descending polyp: Polypoid colorectal mucosa. No evidence of malignancy.    Review of Systems Past Medical History  Diagnosis  Date  . Cancer ([place])     Left breast cancer: Lumpectomy  . Osteoporosis     Past Surgical History  Procedure Laterality Date  . Breast surgery    . Abdominal hysterectomy    . Cholecystectomy    . Cervical disc surgery  2010  . Appendectomy    . Hernia repair    . Cataract extraction w/phaco  09/22/2012    Procedure: CATARACT EXTRACTION PHACO AND INTRAOCULAR LENS PLACEMENT (IOC);  Surgeon: Tonny Branch, MD;  Location: [facility];  Service: Ophthalmology;  Laterality: Right;  CDE=13.95  . Cataract extraction w/phaco Left 10/06/2012    Procedure: CATARACT EXTRACTION PHACO AND INTRAOCULAR LENS PLACEMENT (IOC);  Surgeon: Tonny Branch, MD;  Location: [facility];  Service: Ophthalmology;  Laterality: Left;  CDE: 14.58    No Known Allergies  Current Outpatient Prescriptions on File Prior to Visit  Medication Sig Dispense Refill  . acetaminophen (TYLENOL) 325 MG tablet Take 650 mg by mouth every 6 (six) hours as needed. Pain     No current facility-administered medications on file prior to visit.        Objective:   Physical ExamBlood pressure 100/70, pulse 72, temperature 98.1 F (36.7 C), height 4' 11.5" (1.511 m), weight 145 lb 4.8 oz (65.908 kg).  Alert and oriented. Skin warm and dry. Oral mucosa is moist.   . Sclera anicteric, conjunctivae is pink. Thyroid not enlarged. No cervical lymphadenopathy. Lungs clear. Heart regular rate and rhythm.  Abdomen is soft. Bowel sounds are positive. No hepatomegaly. No abdominal masses felt. No tenderness.  No edema to lower extremities.         Assessment &  Plan:  Heme positive stool. Family hx of colon cancer.Colonic neoplasm needs to be ruled out. Colonoscopy. The risks and benefits such as perforation, bleeding, and infection were reviewed with the patient and is agreeable.

## 2015-08-10 NOTE — Telephone Encounter (Signed)
Patient needs trilyte 

## 2015-08-10 NOTE — Patient Instructions (Signed)
Colonoscopy.  The risks and benefits such as perforation, bleeding, and infection were reviewed with the patient and is agreeable. 

## 2015-08-15 MED ORDER — PEG 3350-KCL-NA BICARB-NACL 420 G PO SOLR: 4000.0000 mL | Freq: Once | ORAL | Status: DC

## 2015-09-01 ENCOUNTER — Encounter (HOSPITAL_COMMUNITY)
Admission: RE | Disposition: A | Discharge status: Home / Self Care | Payer: Self-pay | Source: Ambulatory Visit | Attending: Internal Medicine

## 2015-09-01 ENCOUNTER — Encounter (HOSPITAL_COMMUNITY): Payer: Self-pay | Admitting: *Deleted

## 2015-09-01 ENCOUNTER — Ambulatory Visit (HOSPITAL_COMMUNITY)
Admission: RE | Admit: 2015-09-01 | Discharge: 2015-09-01 | Disposition: A | Discharge status: Home / Self Care | Payer: Medicare Other | Source: Ambulatory Visit | Attending: Internal Medicine | Admitting: Internal Medicine

## 2015-09-01 DIAGNOSIS — D123 Benign neoplasm of transverse colon: Secondary | ICD-10-CM | POA: Diagnosis not present

## 2015-09-01 DIAGNOSIS — K5730 Diverticulosis of large intestine without perforation or abscess without bleeding: Secondary | ICD-10-CM | POA: Diagnosis not present

## 2015-09-01 DIAGNOSIS — Z853 Personal history of malignant neoplasm of breast: Secondary | ICD-10-CM | POA: Diagnosis not present

## 2015-09-01 DIAGNOSIS — Z800 Family history of malignant neoplasm of digestive organs: Secondary | ICD-10-CM | POA: Diagnosis not present

## 2015-09-01 DIAGNOSIS — D126 Benign neoplasm of colon, unspecified: Secondary | ICD-10-CM | POA: Diagnosis not present

## 2015-09-01 DIAGNOSIS — K649 Unspecified hemorrhoids: Secondary | ICD-10-CM | POA: Diagnosis not present

## 2015-09-01 DIAGNOSIS — Z86010 Personal history of colonic polyps: Secondary | ICD-10-CM | POA: Diagnosis not present

## 2015-09-01 DIAGNOSIS — R195 Other fecal abnormalities: Secondary | ICD-10-CM

## 2015-09-01 DIAGNOSIS — K648 Other hemorrhoids: Secondary | ICD-10-CM | POA: Diagnosis not present

## 2015-09-01 HISTORY — PX: COLONOSCOPY: SHX5424

## 2015-09-01 SURGERY — COLONOSCOPY
Anesthesia: Moderate Sedation

## 2015-09-01 MED ORDER — STERILE WATER FOR IRRIGATION IR SOLN
Status: DC | PRN
  Administered 2015-09-01: 11:00:00

## 2015-09-01 MED ORDER — MEPERIDINE HCL 50 MG/ML IJ SOLN
INTRAMUSCULAR | Status: DC
Start: 2015-09-01 — End: 2015-09-01
  Filled 2015-09-01: qty 1

## 2015-09-01 MED ORDER — SODIUM CHLORIDE 0.9 % IV SOLN
INTRAVENOUS | Status: DC
  Administered 2015-09-01: 11:00:00 via INTRAVENOUS

## 2015-09-01 MED ORDER — MEPERIDINE HCL 50 MG/ML IJ SOLN
INTRAMUSCULAR | Status: DC | PRN
  Administered 2015-09-01 (×2): 25 mg via INTRAVENOUS

## 2015-09-01 MED ORDER — MIDAZOLAM HCL 5 MG/5ML IJ SOLN
INTRAMUSCULAR | Status: AC
  Filled 2015-09-01: qty 10

## 2015-09-01 MED ORDER — MIDAZOLAM HCL 5 MG/5ML IJ SOLN
INTRAMUSCULAR | Status: DC | PRN
  Administered 2015-09-01: 2 mg via INTRAVENOUS
  Administered 2015-09-01 (×2): 1 mg via INTRAVENOUS

## 2015-09-01 NOTE — Discharge Instructions (Signed)
Resume usual medications and high fiber diet. No driving for 24 hours. Physician will call with biopsy results.  Colonoscopy, Care After Refer to this sheet in the next few weeks. These instructions provide you with information on caring for yourself after your procedure. Your health care provider may also give you more specific instructions. Your treatment has been planned according to current medical practices, but problems sometimes occur. Call your health care provider if you have any problems or questions after your procedure. WHAT TO EXPECT AFTER THE PROCEDURE  After your procedure, it is typical to have the following:  A small amount of blood in your stool.  Moderate amounts of gas and mild abdominal cramping or bloating. HOME CARE INSTRUCTIONS  Do not drive, operate machinery, or sign important documents for 24 hours.  You may shower and resume your regular physical activities, but move at a slower pace for the first 24 hours.  Take frequent rest periods for the first 24 hours.  Walk around or put a warm pack on your abdomen to help reduce abdominal cramping and bloating.  Drink enough fluids to keep your urine clear or pale yellow.  You may resume your normal diet as instructed by your health care provider. Avoid heavy or fried foods that are hard to digest.  Avoid drinking alcohol for 24 hours or as instructed by your health care provider.  Only take over-the-counter or prescription medicines as directed by your health care provider.  If a tissue sample (biopsy) was taken during your procedure:  Do not take aspirin or blood thinners for 7 days, or as instructed by your health care provider.  Do not drink alcohol for 7 days, or as instructed by your health care provider.  Eat soft foods for the first 24 hours. SEEK MEDICAL CARE IF: You have persistent spotting of blood in your stool 2-3 days after the procedure. SEEK IMMEDIATE MEDICAL CARE IF:  You have more than a  small spotting of blood in your stool.  You pass large blood clots in your stool.  Your abdomen is swollen (distended).  You have nausea or vomiting.  You have a fever.  You have increasing abdominal pain that is not relieved with medicine.   This information is not intended to replace advice given to you by your health care provider. Make sure you discuss any questions you have with your health care provider.   Document Released: 03/27/2004 Document Revised: 06/03/2013 Document Reviewed: 04/20/2013 Elsevier Interactive Patient Education 2016 Elsevier Inc.  High-Fiber Diet Fiber, also called dietary fiber, is a type of carbohydrate found in fruits, vegetables, whole grains, and beans. A high-fiber diet can have many health benefits. Your health care provider may recommend a high-fiber diet to help:  Prevent constipation. Fiber can make your bowel movements more regular.  Lower your cholesterol.  Relieve hemorrhoids, uncomplicated diverticulosis, or irritable bowel syndrome.  Prevent overeating as part of a weight-loss plan.  Prevent heart disease, type 2 diabetes, and certain cancers. WHAT IS MY PLAN? The recommended daily intake of fiber includes:  38 grams for men under age 67.  60 grams for men over age 75.  2 grams for women under age 65.  72 grams for women over age 71. You can get the recommended daily intake of dietary fiber by eating a variety of fruits, vegetables, grains, and beans. Your health care provider may also recommend a fiber supplement if it is not possible to get enough fiber through your diet. WHAT DO  I NEED TO KNOW ABOUT A HIGH-FIBER DIET?  Fiber supplements have not been widely studied for their effectiveness, so it is better to get fiber through food sources.  Always check the fiber content on thenutrition facts label of any prepackaged food. Look for foods that contain at least 5 grams of fiber per serving.  Ask your dietitian if you have  questions about specific foods that are related to your condition, especially if those foods are not listed in the following section.  Increase your daily fiber consumption gradually. Increasing your intake of dietary fiber too quickly may cause bloating, cramping, or gas.  Drink plenty of water. Water helps you to digest fiber. WHAT FOODS CAN I EAT? Grains Whole-grain breads. Multigrain cereal. Oats and oatmeal. Brown rice. Barley. Bulgur wheat. [place]. Bran muffins. Popcorn. Rye wafer crackers. Vegetables Sweet potatoes. Spinach. Kale. Artichokes. Cabbage. Broccoli. Green peas. Carrots. Squash. Fruits Berries. Pears. Apples. Oranges. Avocados. Prunes and raisins. Dried figs. Meats and Other Protein Sources [place], kidney, pinto, and soy beans. Split peas. Lentils. Nuts and seeds. Dairy Fiber-fortified yogurt. Beverages Fiber-fortified soy milk. Fiber-fortified orange juice. Other Fiber bars. The items listed above may not be a complete list of recommended foods or beverages. Contact your dietitian for more options. WHAT FOODS ARE NOT RECOMMENDED? Grains White bread. Pasta made with refined flour. White rice. Vegetables Fried potatoes. Canned vegetables. Well-cooked vegetables.  Fruits Fruit juice. Cooked, strained fruit. Meats and Other Protein Sources Fatty cuts of meat. Fried Sales executive or fried fish. Dairy Milk. Yogurt. Cream cheese. Sour cream. Beverages Soft drinks. Other Cakes and pastries. Butter and oils. The items listed above may not be a complete list of foods and beverages to avoid. Contact your dietitian for more information. WHAT ARE SOME TIPS FOR INCLUDING HIGH-FIBER FOODS IN MY DIET?  Eat a wide variety of high-fiber foods.  Make sure that half of all grains consumed each day are whole grains.  Replace breads and cereals made from refined flour or white flour with whole-grain breads and cereals.  Replace white rice with brown rice, bulgur wheat, or  millet.  Start the day with a breakfast that is high in fiber, such as a cereal that contains at least 5 grams of fiber per serving.  Use beans in place of meat in soups, salads, or pasta.  Eat high-fiber snacks, such as berries, raw vegetables, nuts, or popcorn.   This information is not intended to replace advice given to you by your health care provider. Make sure you discuss any questions you have with your health care provider.   Document Released: 08/13/2005 Document Revised: 09/03/2014 Document Reviewed: 01/26/2014 Elsevier Interactive Patient Education [employer].

## 2015-09-01 NOTE — Op Note (Addendum)
COLONOSCOPY PROCEDURE REPORT  PATIENT:  Melanie David  MR#:  [id number] Birthdate:  Sep 02, 1945, 70 y.o., female Endoscopist:  Dr. Rogene Houston, MD Referred By:  Dr. Monico Blitz, MD  Procedure Date: 09/01/2015  Procedure:   Colonoscopy  Indications:  Patient is 70 year old Caucasian female was noted having positive stool. She denies abdominal pain melena or rectal bleeding. Family history is significant for CRC in brother who is in his early 29s and I was diagnosed with metastatic colon carcinoma last year and family history is also significant for colon carcinoma in paternal grandfather who is in his 34s of the time of diagnosis. Patient's last colonoscopy was in June 2012.  Informed Consent:  The procedure and risks were reviewed with the patient and informed consent was obtained.  Medications:  Demerol 50 mg IV Versed 4 mg IV Moderate sedation started @10 :57 A.M. procedure ended-scope out @11 :18 A.M.  Description of procedure:  After a digital rectal exam was performed, that colonoscope was advanced from the anus through the rectum and colon to the area of the cecum, ileocecal valve and appendiceal orifice. The cecum was deeply intubated. These structures were well-seen and photographed for the record. From the level of the cecum and ileocecal valve, the scope was slowly and cautiously withdrawn. The mucosal surfaces were carefully surveyed utilizing scope tip to flexion to facilitate fold flattening as needed. The scope was pulled down into the rectum where a thorough exam including retroflexion was performed.  Findings:   Prep excellent. 4 mm polyp cold snared from proximal transverse colon. Altered will diverticula noted at sigmoid colon some of which were large. Normal rectal mucosa. Small hemorrhoids below the dentate line.   Therapeutic/Diagnostic Maneuvers Performed:  See above  Complications:  None  EBL: None  Cecal Withdrawal Time:  11 minutes  Impression:  Examination  performed to cecum. Small polyp cold snared from proximal transverse colon. Multiple diverticula at sigmoid colon.  Small external hemorrhoids.   Recommendations:  Standard instructions given. High fiber diet. I will contact patient with biopsy results and further recommendations.  REHMAN,NAJEEB U  09/01/2015 11:30 AM  CC: Dr. Monico Blitz, MD & Dr. Rayne Du ref. provider found

## 2015-09-01 NOTE — H&P (Signed)
Melanie David is an 70 y.o. female.   Chief Complaint:   Patient is here for colonoscopy. HPI:  Asian is 70 year old Caucasian email family history significant for CRC Brother was diagnosed with metastatic colon carcinoma last year. He is in his early 57s. He did not undergo surgery to resect primary. Family history is also significant for colon carcinoma in paternal grandfather who was in his 29s. Patient's last colonoscopy was in June 2012 with removal of small polyp was not an adenoma.  Past Medical History  Diagnosis Date  . Cancer ([place])     Left breast cancer: Lumpectomy  . Osteoporosis     Past Surgical History  Procedure Laterality Date  . Breast surgery    . Abdominal hysterectomy    . Cholecystectomy    . Cervical disc surgery  2010  . Appendectomy    . Hernia repair    . Cataract extraction w/phaco  09/22/2012    Procedure: CATARACT EXTRACTION PHACO AND INTRAOCULAR LENS PLACEMENT (IOC);  Surgeon: Tonny Branch, MD;  Location: [facility];  Service: Ophthalmology;  Laterality: Right;  CDE=13.95  . Cataract extraction w/phaco Left 10/06/2012    Procedure: CATARACT EXTRACTION PHACO AND INTRAOCULAR LENS PLACEMENT (IOC);  Surgeon: Tonny Branch, MD;  Location: [facility];  Service: Ophthalmology;  Laterality: Left;  CDE: 14.58    Family History  Problem Relation Age of Onset  . Heart disease Father    Social History:  reports that she has never smoked. She has never used smokeless tobacco. She reports that she does not drink alcohol or use illicit drugs.  Allergies: No Known Allergies  Medications Prior to Admission  Medication Sig Dispense Refill  . acetaminophen (TYLENOL) 325 MG tablet Take 650 mg by mouth every 6 (six) hours as needed. Pain    . polyethylene glycol-electrolytes (NULYTELY/GOLYTELY) 420 G solution Take 4,000 mLs by mouth once. 4000 mL 0    No results found for this or any previous visit (from the past 48 hour(s)). No results found.  ROS  Blood pressure 139/77, pulse  80, temperature 98.5 F (36.9 C), temperature source Oral, resp. rate 16, height 4' 9.5" (1.461 m), weight 143 lb (64.864 kg), SpO2 100 %. Physical Exam  Constitutional: She appears well-developed and well-nourished.  HENT:  Mouth/Throat: Oropharynx is clear and moist.  Eyes: Conjunctivae are normal. No scleral icterus.  Neck: No thyromegaly present.  Cardiovascular: Normal rate, regular rhythm and normal heart sounds.   No murmur heard. Respiratory: Effort normal and breath sounds normal.  GI: Soft. She exhibits no distension and no mass. There is no tenderness.  Lymphadenopathy:    She has no cervical adenopathy.  Neurological: She is alert.  Skin: Skin is warm and dry.     Assessment/Plan  Heme-positive stool.  Family History of CRC in Brother and grandparent,  Diagnostic colonoscopy.  Melanie David 09/01/2015, 10:51 AM

## 2015-09-05 ENCOUNTER — Encounter (HOSPITAL_COMMUNITY): Payer: Self-pay | Admitting: Internal Medicine

## 2015-09-21 DIAGNOSIS — L710 Perioral dermatitis: Secondary | ICD-10-CM | POA: Diagnosis not present

## 2015-11-07 DIAGNOSIS — L710 Perioral dermatitis: Secondary | ICD-10-CM | POA: Diagnosis not present

## 2016-02-13 DIAGNOSIS — K219 Gastro-esophageal reflux disease without esophagitis: Secondary | ICD-10-CM | POA: Diagnosis not present

## 2016-02-13 DIAGNOSIS — F419 Anxiety disorder, unspecified: Secondary | ICD-10-CM | POA: Diagnosis not present

## 2016-02-13 DIAGNOSIS — M25561 Pain in right knee: Secondary | ICD-10-CM | POA: Diagnosis not present

## 2016-02-13 DIAGNOSIS — Z789 Other specified health status: Secondary | ICD-10-CM | POA: Diagnosis not present

## 2016-02-13 DIAGNOSIS — M25569 Pain in unspecified knee: Secondary | ICD-10-CM | POA: Diagnosis not present

## 2016-02-13 DIAGNOSIS — Z6829 Body mass index (BMI) 29.0-29.9, adult: Secondary | ICD-10-CM | POA: Diagnosis not present

## 2016-03-08 DIAGNOSIS — M25461 Effusion, right knee: Secondary | ICD-10-CM | POA: Diagnosis not present

## 2016-03-08 DIAGNOSIS — Z299 Encounter for prophylactic measures, unspecified: Secondary | ICD-10-CM | POA: Diagnosis not present

## 2016-03-08 DIAGNOSIS — M25561 Pain in right knee: Secondary | ICD-10-CM | POA: Diagnosis not present

## 2016-03-22 DIAGNOSIS — M79672 Pain in left foot: Secondary | ICD-10-CM | POA: Diagnosis not present

## 2016-03-22 DIAGNOSIS — Z299 Encounter for prophylactic measures, unspecified: Secondary | ICD-10-CM | POA: Diagnosis not present

## 2016-03-22 DIAGNOSIS — M25561 Pain in right knee: Secondary | ICD-10-CM | POA: Diagnosis not present

## 2016-04-09 DIAGNOSIS — M79675 Pain in left toe(s): Secondary | ICD-10-CM | POA: Diagnosis not present

## 2016-04-09 DIAGNOSIS — S92515A Nondisplaced fracture of proximal phalanx of left lesser toe(s), initial encounter for closed fracture: Secondary | ICD-10-CM | POA: Diagnosis not present

## 2016-04-09 DIAGNOSIS — M25561 Pain in right knee: Secondary | ICD-10-CM | POA: Diagnosis not present

## 2016-05-01 DIAGNOSIS — M25561 Pain in right knee: Secondary | ICD-10-CM | POA: Diagnosis not present

## 2016-05-01 DIAGNOSIS — S92515A Nondisplaced fracture of proximal phalanx of left lesser toe(s), initial encounter for closed fracture: Secondary | ICD-10-CM | POA: Diagnosis not present

## 2016-05-04 ENCOUNTER — Encounter (INDEPENDENT_AMBULATORY_CARE_PROVIDER_SITE_OTHER): Payer: Self-pay

## 2016-05-08 ENCOUNTER — Encounter (INDEPENDENT_AMBULATORY_CARE_PROVIDER_SITE_OTHER): Payer: Self-pay

## 2016-05-08 DIAGNOSIS — S83241A Other tear of medial meniscus, current injury, right knee, initial encounter: Secondary | ICD-10-CM | POA: Diagnosis not present

## 2016-05-08 DIAGNOSIS — M25561 Pain in right knee: Secondary | ICD-10-CM | POA: Diagnosis not present

## 2016-05-18 DIAGNOSIS — S83221D Peripheral tear of medial meniscus, current injury, right knee, subsequent encounter: Secondary | ICD-10-CM | POA: Diagnosis not present

## 2016-07-09 DIAGNOSIS — S83221D Peripheral tear of medial meniscus, current injury, right knee, subsequent encounter: Secondary | ICD-10-CM | POA: Diagnosis not present

## 2016-07-12 DIAGNOSIS — Z6829 Body mass index (BMI) 29.0-29.9, adult: Secondary | ICD-10-CM | POA: Diagnosis not present

## 2016-07-12 DIAGNOSIS — E7800 Pure hypercholesterolemia, unspecified: Secondary | ICD-10-CM | POA: Diagnosis not present

## 2016-07-12 DIAGNOSIS — Z299 Encounter for prophylactic measures, unspecified: Secondary | ICD-10-CM | POA: Diagnosis not present

## 2016-07-12 DIAGNOSIS — Z1211 Encounter for screening for malignant neoplasm of colon: Secondary | ICD-10-CM | POA: Diagnosis not present

## 2016-07-12 DIAGNOSIS — Z1389 Encounter for screening for other disorder: Secondary | ICD-10-CM | POA: Diagnosis not present

## 2016-07-12 DIAGNOSIS — Z7189 Other specified counseling: Secondary | ICD-10-CM | POA: Diagnosis not present

## 2016-07-12 DIAGNOSIS — Z0000 Encounter for general adult medical examination without abnormal findings: Secondary | ICD-10-CM | POA: Diagnosis not present

## 2016-07-13 DIAGNOSIS — R5383 Other fatigue: Secondary | ICD-10-CM | POA: Diagnosis not present

## 2016-07-13 DIAGNOSIS — E7800 Pure hypercholesterolemia, unspecified: Secondary | ICD-10-CM | POA: Diagnosis not present

## 2016-07-24 DIAGNOSIS — Z23 Encounter for immunization: Secondary | ICD-10-CM | POA: Diagnosis not present

## 2016-08-06 DIAGNOSIS — Z1231 Encounter for screening mammogram for malignant neoplasm of breast: Secondary | ICD-10-CM | POA: Diagnosis not present

## 2016-08-07 DIAGNOSIS — Z853 Personal history of malignant neoplasm of breast: Secondary | ICD-10-CM | POA: Diagnosis not present

## 2016-08-07 DIAGNOSIS — Z8249 Family history of ischemic heart disease and other diseases of the circulatory system: Secondary | ICD-10-CM | POA: Diagnosis not present

## 2016-08-07 DIAGNOSIS — M8580 Other specified disorders of bone density and structure, unspecified site: Secondary | ICD-10-CM | POA: Diagnosis not present

## 2016-08-07 DIAGNOSIS — X58XXXA Exposure to other specified factors, initial encounter: Secondary | ICD-10-CM | POA: Diagnosis not present

## 2016-08-07 DIAGNOSIS — M1711 Unilateral primary osteoarthritis, right knee: Secondary | ICD-10-CM | POA: Diagnosis not present

## 2016-08-07 DIAGNOSIS — Z9049 Acquired absence of other specified parts of digestive tract: Secondary | ICD-10-CM | POA: Diagnosis not present

## 2016-08-07 DIAGNOSIS — S83241A Other tear of medial meniscus, current injury, right knee, initial encounter: Secondary | ICD-10-CM | POA: Diagnosis not present

## 2016-08-07 DIAGNOSIS — F419 Anxiety disorder, unspecified: Secondary | ICD-10-CM | POA: Diagnosis not present

## 2016-11-21 DIAGNOSIS — F419 Anxiety disorder, unspecified: Secondary | ICD-10-CM | POA: Diagnosis not present

## 2016-11-21 DIAGNOSIS — Z6830 Body mass index (BMI) 30.0-30.9, adult: Secondary | ICD-10-CM | POA: Diagnosis not present

## 2016-11-21 DIAGNOSIS — Z299 Encounter for prophylactic measures, unspecified: Secondary | ICD-10-CM | POA: Diagnosis not present

## 2016-11-21 DIAGNOSIS — N644 Mastodynia: Secondary | ICD-10-CM | POA: Diagnosis not present

## 2016-11-21 DIAGNOSIS — Z713 Dietary counseling and surveillance: Secondary | ICD-10-CM | POA: Diagnosis not present

## 2016-11-28 DIAGNOSIS — M549 Dorsalgia, unspecified: Secondary | ICD-10-CM | POA: Diagnosis not present

## 2016-11-28 DIAGNOSIS — Z9889 Other specified postprocedural states: Secondary | ICD-10-CM | POA: Diagnosis not present

## 2016-11-28 DIAGNOSIS — R928 Other abnormal and inconclusive findings on diagnostic imaging of breast: Secondary | ICD-10-CM | POA: Diagnosis not present

## 2016-11-28 DIAGNOSIS — N644 Mastodynia: Secondary | ICD-10-CM | POA: Diagnosis not present

## 2016-11-28 DIAGNOSIS — M79602 Pain in left arm: Secondary | ICD-10-CM | POA: Diagnosis not present

## 2016-12-06 DIAGNOSIS — M810 Age-related osteoporosis without current pathological fracture: Secondary | ICD-10-CM | POA: Diagnosis not present

## 2016-12-06 DIAGNOSIS — Z6830 Body mass index (BMI) 30.0-30.9, adult: Secondary | ICD-10-CM | POA: Diagnosis not present

## 2016-12-06 DIAGNOSIS — J329 Chronic sinusitis, unspecified: Secondary | ICD-10-CM | POA: Diagnosis not present

## 2016-12-06 DIAGNOSIS — E7800 Pure hypercholesterolemia, unspecified: Secondary | ICD-10-CM | POA: Diagnosis not present

## 2016-12-06 DIAGNOSIS — Z299 Encounter for prophylactic measures, unspecified: Secondary | ICD-10-CM | POA: Diagnosis not present

## 2016-12-30 DIAGNOSIS — R42 Dizziness and giddiness: Secondary | ICD-10-CM | POA: Diagnosis not present

## 2016-12-30 DIAGNOSIS — Z853 Personal history of malignant neoplasm of breast: Secondary | ICD-10-CM | POA: Diagnosis not present

## 2016-12-30 DIAGNOSIS — M1990 Unspecified osteoarthritis, unspecified site: Secondary | ICD-10-CM | POA: Diagnosis not present

## 2017-01-01 DIAGNOSIS — F419 Anxiety disorder, unspecified: Secondary | ICD-10-CM | POA: Diagnosis not present

## 2017-01-01 DIAGNOSIS — R42 Dizziness and giddiness: Secondary | ICD-10-CM | POA: Diagnosis not present

## 2017-01-01 DIAGNOSIS — Z6830 Body mass index (BMI) 30.0-30.9, adult: Secondary | ICD-10-CM | POA: Diagnosis not present

## 2017-01-01 DIAGNOSIS — Z299 Encounter for prophylactic measures, unspecified: Secondary | ICD-10-CM | POA: Diagnosis not present

## 2017-01-01 DIAGNOSIS — M5430 Sciatica, unspecified side: Secondary | ICD-10-CM | POA: Diagnosis not present

## 2017-01-01 DIAGNOSIS — Z6829 Body mass index (BMI) 29.0-29.9, adult: Secondary | ICD-10-CM | POA: Diagnosis not present

## 2017-01-07 DIAGNOSIS — R51 Headache: Secondary | ICD-10-CM | POA: Diagnosis not present

## 2017-01-07 DIAGNOSIS — Z789 Other specified health status: Secondary | ICD-10-CM | POA: Diagnosis not present

## 2017-01-07 DIAGNOSIS — Z299 Encounter for prophylactic measures, unspecified: Secondary | ICD-10-CM | POA: Diagnosis not present

## 2017-01-07 DIAGNOSIS — E7800 Pure hypercholesterolemia, unspecified: Secondary | ICD-10-CM | POA: Diagnosis not present

## 2017-01-07 DIAGNOSIS — R42 Dizziness and giddiness: Secondary | ICD-10-CM | POA: Diagnosis not present

## 2017-01-07 DIAGNOSIS — Z6830 Body mass index (BMI) 30.0-30.9, adult: Secondary | ICD-10-CM | POA: Diagnosis not present

## 2017-01-09 DIAGNOSIS — R42 Dizziness and giddiness: Secondary | ICD-10-CM | POA: Diagnosis not present

## 2017-01-09 DIAGNOSIS — R51 Headache: Secondary | ICD-10-CM | POA: Diagnosis not present

## 2017-01-11 ENCOUNTER — Encounter: Payer: Self-pay | Admitting: Neurology

## 2017-01-14 ENCOUNTER — Other Ambulatory Visit: Payer: Medicare Other

## 2017-01-14 ENCOUNTER — Ambulatory Visit (INDEPENDENT_AMBULATORY_CARE_PROVIDER_SITE_OTHER): Payer: Medicare Other | Admitting: Neurology

## 2017-01-14 ENCOUNTER — Encounter: Payer: Self-pay | Admitting: Neurology

## 2017-01-14 VITALS — BP 134/62 | HR 68 | Ht <= 58 in | Wt 147.0 lb

## 2017-01-14 DIAGNOSIS — G629 Polyneuropathy, unspecified: Secondary | ICD-10-CM

## 2017-01-14 DIAGNOSIS — M542 Cervicalgia: Secondary | ICD-10-CM | POA: Diagnosis not present

## 2017-01-14 DIAGNOSIS — R42 Dizziness and giddiness: Secondary | ICD-10-CM | POA: Diagnosis not present

## 2017-01-14 NOTE — Progress Notes (Signed)
NEUROLOGY CONSULTATION NOTE  Melanie David MRN: [id number] DOB: [birth year]  Referring provider: Dr. Monico Blitz Primary care provider: Dr. Monico Blitz  Reason for consult:  dizziness  Dear Dr Manuella Ghazi:  Thank you for your kind referral of Melanie David for consultation of the above symptoms. Although her history is well known to you, please allow me to reiterate it for the purpose of our medical record. Records and images were personally reviewed where available.  HISTORY OF PRESENT ILLNESS: This is a very pleasant 71 year old right-handed woman with a history of breast cancer s/p lumpectomy presenting for evaluation of dizziness. She was in her usual state of health until 12/25/2016 when she started feeling "swimmy-headed." She describes a pressure on the vertex like a cap on her head that makes her feel swimmy-headed. She went to [facility] and had a head CT afterwards, reported as normal. Report unavailable for review. She reports that since then, she has had intermittent episodes of the sensation. She states it is not spinning, feels like a pressure, "just does not feel right." Sometimes she feels a little off balance, denies any falls. She reports Saturday was a good day, she did not notice any symptoms, but since then, symptoms come and go. She has a similar pressure sensation in her neck region, worse when she is lying in bed or wearing a bra. She describes a hot pressure in her left lower back like a strap or masking tape in that region. She denies any diplopia, tinnitus, hearing loss, dysarthria/dysphagia, focal numbness/tingling/weakness, bowel/bladder dysfunction. She had an ear infection 2 months ago, no recent head injuries. No prior history of similar symptoms or history of headaches.   PAST MEDICAL HISTORY: Past Medical History:  Diagnosis Date  . Cancer ([place])    Left breast cancer: Lumpectomy  . Osteoporosis     PAST SURGICAL HISTORY: Past Surgical History:  Procedure  Laterality Date  . ABDOMINAL HYSTERECTOMY    . APPENDECTOMY    . BREAST SURGERY    . CATARACT EXTRACTION W/PHACO  09/22/2012   Procedure: CATARACT EXTRACTION PHACO AND INTRAOCULAR LENS PLACEMENT (IOC);  Surgeon: Tonny Branch, MD;  Location: [facility];  Service: Ophthalmology;  Laterality: Right;  CDE=13.95  . CATARACT EXTRACTION W/PHACO Left 10/06/2012   Procedure: CATARACT EXTRACTION PHACO AND INTRAOCULAR LENS PLACEMENT (IOC);  Surgeon: Tonny Branch, MD;  Location: [facility];  Service: Ophthalmology;  Laterality: Left;  CDE: 14.58  . [place] SURGERY  2010  . CHOLECYSTECTOMY    . COLONOSCOPY N/A 09/01/2015   Procedure: COLONOSCOPY;  Surgeon: Rogene Houston, MD;  Location: [facility];  Service: Endoscopy;  Laterality: N/A;  9:30 - moved to 11:00 - Ann to notify pt  . HERNIA REPAIR      MEDICATIONS: Current Outpatient Prescriptions on File Prior to Visit  Medication Sig Dispense Refill  . acetaminophen (TYLENOL) 325 MG tablet Take 650 mg by mouth every 6 (six) hours as needed. Pain     No current facility-administered medications on file prior to visit.     ALLERGIES: No Known Allergies  FAMILY HISTORY: Family History  Problem Relation Age of Onset  . Heart disease Father     SOCIAL HISTORY: Social History   Social History  . Marital status: Married    Spouse name: N/A  . Number of children: N/A  . Years of education: N/A   Occupational History  . Not on file.   Social History Main Topics  . Smoking  status: Never Smoker  . Smokeless tobacco: Never Used  . Alcohol use No  . Drug use: No  . Sexual activity: Yes    Birth control/ protection: Surgical   Other Topics Concern  . Not on file   Social History Narrative  . No narrative on file    REVIEW OF SYSTEMS: Constitutional: No fevers, chills, or sweats, no generalized fatigue, change in appetite Eyes: No visual changes, double vision, eye pain Ear, nose and throat: No hearing loss, ear pain, nasal congestion,  sore throat Cardiovascular: No chest pain, palpitations Respiratory:  No shortness of breath at rest or with exertion, wheezes GastrointestinaI: No nausea, vomiting, diarrhea, abdominal pain, fecal incontinence Genitourinary:  No dysuria, urinary retention or frequency Musculoskeletal:  + neck pain, back pain Integumentary: No rash, pruritus, skin lesions Neurological: as above Psychiatric: No depression, insomnia, anxiety Endocrine: No palpitations, fatigue, diaphoresis, mood swings, change in appetite, change in weight, increased thirst Hematologic/Lymphatic:  No anemia, purpura, petechiae. Allergic/Immunologic: no itchy/runny eyes, nasal congestion, recent allergic reactions, rashes  PHYSICAL EXAM: Vitals:   01/14/17 1037  BP: 134/62  Pulse: 68   General: No acute distress Head:  Normocephalic/atraumatic Eyes: Fundoscopic exam shows bilateral sharp discs, no vessel changes, exudates, or hemorrhages Neck: supple, no paraspinal tenderness, full range of motion Back: No paraspinal tenderness Heart: regular rate and rhythm Lungs: Clear to auscultation bilaterally. Vascular: No carotid bruits. Skin/Extremities: No rash, no edema Neurological Exam: Mental status: alert and oriented to person, place, and time, no dysarthria or aphasia, Fund of knowledge is appropriate.  Recent and remote memory are intact.  Attention and concentration are normal.    Able to name objects and repeat phrases. Cranial nerves: CN I: not tested CN II: pupils equal, round and reactive to light, visual fields intact, fundi unremarkable. CN III, IV, VI:  full range of motion, no nystagmus, no ptosis CN V: facial sensation intact CN VII: upper and lower face symmetric CN VIII: hearing intact to finger rub CN IX, X: gag intact, uvula midline CN XI: sternocleidomastoid and trapezius muscles intact CN XII: tongue midline Bulk & Tone: normal, no fasciculations. Motor: 5/5 throughout with no pronator  drift. Sensation: intact to light touch, cold, pin, vibration and joint position sense.  No extinction to double simultaneous stimulation.  Romberg test positive sway Deep Tendon Reflexes: +2 throughout except for absent ankle jerks bilaterally, no ankle clonus Plantar responses: downgoing bilaterally Cerebellar: no incoordination on finger to nose, heel to shin. No dysdiadochokinesia Gait: narrow-based and steady,mild difficulty with tandem walk but able Tremor: none  IMPRESSION: This is a very pleasant 71 year old right-handed woman with a history of breast cancer presenting with a 3-week history of intermittent pressure over the vertex region with associated "swimmy-headed" sensation. She also describes a similar pressure sensation in the neck region. Her neurological exam is non-focal, there is note of positive Romberg sign and absent ankle jerks indicating mild peripheral neuropathy. She reports having a normal head CT, report will be requested for review. The etiology of her symptoms is unclear, cervicogenic dizziness is considered. Neuropathy can cause a dizzy sensation but would not necessarily cause the head pressure. Bloodwork for TSH, B12, ESR, CRP, SPEP/IFE for neuropathy will be ordered. She will be referred to PT for neck pain to see if this will help with her symptoms. She will follow-up in 3 months and knows to call for any changes.   Thank you for allowing me to participate in the care of this  patient. Please do not hesitate to call for any questions or concerns.   Ellouise Newer, M.D.  CC: Dr. Manuella Ghazi

## 2017-01-14 NOTE — Patient Instructions (Signed)
1. Bloodwork for TSH, B12, ESR, CRP, SPEP/IFE 2. Refer to Physical therapy for neck pain 3. Records from [place] will be requested for review 4. Follow-up in 3 months, call for any changes

## 2017-01-15 ENCOUNTER — Telehealth: Payer: Self-pay

## 2017-01-15 LAB — SEDIMENTATION RATE: Sed Rate: 14 mm/hr (ref 0–30)

## 2017-01-15 LAB — TSH: TSH: 0.93 mIU/L

## 2017-01-15 LAB — VITAMIN B12: Vitamin B-12: 636 pg/mL (ref 200–1100)

## 2017-01-15 LAB — C-REACTIVE PROTEIN: CRP: 18.4 mg/L — ABNORMAL HIGH (ref <8.0)

## 2017-01-15 NOTE — Telephone Encounter (Signed)
Spoke with pt relaying message below.  She states that she has had lymphedema and has had on again off again bouts of inflammation since her surgery.  She is fairly confident that this is why one of her markers came back elevated.  I let her know that I would pass the message along to Dr. Delice Lesch and see if she still recommends repeating CRP.

## 2017-01-15 NOTE — Telephone Encounter (Signed)
Patient returned your call.  Thanks!

## 2017-01-15 NOTE — Telephone Encounter (Signed)
Ok, thanks for info. Let's hold off on CRP for now and start with the neck PT first and see how she feels after PT. Thanks

## 2017-01-15 NOTE — Telephone Encounter (Signed)
-----   Message from Cameron Sprang, MD sent at 01/15/2017  9:54 AM EDT ----- Please let her know the thyroid and B12 are normal. One of the markers for inflammation was normal, but the other one was elevated, this may be a lab error, would re-check CRP to confirm, thanks

## 2017-01-15 NOTE — Telephone Encounter (Signed)
Called pt relay message below.  No answer and VM is not set up

## 2017-01-16 NOTE — Telephone Encounter (Signed)
Returned pt call relaying previous message about hold off on repeat CRP for now.  Pt appreciative

## 2017-01-22 ENCOUNTER — Telehealth: Payer: Self-pay | Admitting: Neurology

## 2017-01-22 NOTE — Telephone Encounter (Signed)
Records from [facility] reviewed: 01/09/2017 Head CT normal for age.

## 2017-03-12 ENCOUNTER — Ambulatory Visit: Payer: Medicare Other | Admitting: Neurology

## 2017-04-26 ENCOUNTER — Ambulatory Visit: Payer: Medicare Other | Admitting: Neurology

## 2017-07-04 DIAGNOSIS — E7800 Pure hypercholesterolemia, unspecified: Secondary | ICD-10-CM | POA: Diagnosis not present

## 2017-07-04 DIAGNOSIS — Z299 Encounter for prophylactic measures, unspecified: Secondary | ICD-10-CM | POA: Diagnosis not present

## 2017-07-04 DIAGNOSIS — Z6830 Body mass index (BMI) 30.0-30.9, adult: Secondary | ICD-10-CM | POA: Diagnosis not present

## 2017-07-04 DIAGNOSIS — M952 Other acquired deformity of head: Secondary | ICD-10-CM | POA: Diagnosis not present

## 2017-07-04 DIAGNOSIS — L719 Rosacea, unspecified: Secondary | ICD-10-CM | POA: Diagnosis not present

## 2017-07-04 DIAGNOSIS — R51 Headache: Secondary | ICD-10-CM | POA: Diagnosis not present

## 2017-07-04 DIAGNOSIS — M810 Age-related osteoporosis without current pathological fracture: Secondary | ICD-10-CM | POA: Diagnosis not present

## 2017-07-17 DIAGNOSIS — Z299 Encounter for prophylactic measures, unspecified: Secondary | ICD-10-CM | POA: Diagnosis not present

## 2017-07-17 DIAGNOSIS — Z7189 Other specified counseling: Secondary | ICD-10-CM | POA: Diagnosis not present

## 2017-07-17 DIAGNOSIS — Z1211 Encounter for screening for malignant neoplasm of colon: Secondary | ICD-10-CM | POA: Diagnosis not present

## 2017-07-17 DIAGNOSIS — Z1339 Encounter for screening examination for other mental health and behavioral disorders: Secondary | ICD-10-CM | POA: Diagnosis not present

## 2017-07-17 DIAGNOSIS — Z2821 Immunization not carried out because of patient refusal: Secondary | ICD-10-CM | POA: Diagnosis not present

## 2017-07-17 DIAGNOSIS — F419 Anxiety disorder, unspecified: Secondary | ICD-10-CM | POA: Diagnosis not present

## 2017-07-17 DIAGNOSIS — Z6830 Body mass index (BMI) 30.0-30.9, adult: Secondary | ICD-10-CM | POA: Diagnosis not present

## 2017-07-17 DIAGNOSIS — Z0000 Encounter for general adult medical examination without abnormal findings: Secondary | ICD-10-CM | POA: Diagnosis not present

## 2017-07-17 DIAGNOSIS — Z789 Other specified health status: Secondary | ICD-10-CM | POA: Diagnosis not present

## 2017-07-17 DIAGNOSIS — Z1331 Encounter for screening for depression: Secondary | ICD-10-CM | POA: Diagnosis not present

## 2017-07-17 DIAGNOSIS — E7800 Pure hypercholesterolemia, unspecified: Secondary | ICD-10-CM | POA: Diagnosis not present

## 2017-07-17 DIAGNOSIS — R5383 Other fatigue: Secondary | ICD-10-CM | POA: Diagnosis not present

## 2017-07-17 DIAGNOSIS — Z79899 Other long term (current) drug therapy: Secondary | ICD-10-CM | POA: Diagnosis not present

## 2018-01-16 DIAGNOSIS — L719 Rosacea, unspecified: Secondary | ICD-10-CM | POA: Diagnosis not present

## 2018-04-04 DIAGNOSIS — H40013 Open angle with borderline findings, low risk, bilateral: Secondary | ICD-10-CM | POA: Diagnosis not present

## 2018-04-04 DIAGNOSIS — H40003 Preglaucoma, unspecified, bilateral: Secondary | ICD-10-CM | POA: Diagnosis not present

## 2018-04-04 DIAGNOSIS — H1859 Other hereditary corneal dystrophies: Secondary | ICD-10-CM | POA: Diagnosis not present

## 2018-05-06 DIAGNOSIS — L719 Rosacea, unspecified: Secondary | ICD-10-CM | POA: Diagnosis not present

## 2018-05-07 DIAGNOSIS — K469 Unspecified abdominal hernia without obstruction or gangrene: Secondary | ICD-10-CM | POA: Diagnosis not present

## 2018-05-07 DIAGNOSIS — Z299 Encounter for prophylactic measures, unspecified: Secondary | ICD-10-CM | POA: Diagnosis not present

## 2018-05-07 DIAGNOSIS — Z6830 Body mass index (BMI) 30.0-30.9, adult: Secondary | ICD-10-CM | POA: Diagnosis not present

## 2018-05-07 DIAGNOSIS — Z713 Dietary counseling and surveillance: Secondary | ICD-10-CM | POA: Diagnosis not present

## 2018-05-12 DIAGNOSIS — K432 Incisional hernia without obstruction or gangrene: Secondary | ICD-10-CM | POA: Diagnosis not present

## 2018-05-15 DIAGNOSIS — K439 Ventral hernia without obstruction or gangrene: Secondary | ICD-10-CM | POA: Diagnosis not present

## 2018-05-15 DIAGNOSIS — K449 Diaphragmatic hernia without obstruction or gangrene: Secondary | ICD-10-CM | POA: Diagnosis not present

## 2018-05-15 DIAGNOSIS — K432 Incisional hernia without obstruction or gangrene: Secondary | ICD-10-CM | POA: Diagnosis not present

## 2018-05-15 DIAGNOSIS — K760 Fatty (change of) liver, not elsewhere classified: Secondary | ICD-10-CM | POA: Diagnosis not present

## 2018-05-15 DIAGNOSIS — I700 Atherosclerosis of aorta: Secondary | ICD-10-CM | POA: Diagnosis not present

## 2018-05-15 DIAGNOSIS — K5730 Diverticulosis of large intestine without perforation or abscess without bleeding: Secondary | ICD-10-CM | POA: Diagnosis not present

## 2018-05-15 DIAGNOSIS — K429 Umbilical hernia without obstruction or gangrene: Secondary | ICD-10-CM | POA: Diagnosis not present

## 2018-05-21 DIAGNOSIS — K432 Incisional hernia without obstruction or gangrene: Secondary | ICD-10-CM | POA: Diagnosis not present

## 2018-06-02 DIAGNOSIS — H26493 Other secondary cataract, bilateral: Secondary | ICD-10-CM | POA: Diagnosis not present

## 2018-06-02 DIAGNOSIS — H40013 Open angle with borderline findings, low risk, bilateral: Secondary | ICD-10-CM | POA: Diagnosis not present

## 2018-06-16 DIAGNOSIS — Z9049 Acquired absence of other specified parts of digestive tract: Secondary | ICD-10-CM | POA: Diagnosis not present

## 2018-06-16 DIAGNOSIS — K660 Peritoneal adhesions (postprocedural) (postinfection): Secondary | ICD-10-CM | POA: Diagnosis not present

## 2018-06-16 DIAGNOSIS — Z90710 Acquired absence of both cervix and uterus: Secondary | ICD-10-CM | POA: Diagnosis not present

## 2018-06-16 DIAGNOSIS — R262 Difficulty in walking, not elsewhere classified: Secondary | ICD-10-CM | POA: Diagnosis not present

## 2018-06-16 DIAGNOSIS — K432 Incisional hernia without obstruction or gangrene: Secondary | ICD-10-CM | POA: Diagnosis not present

## 2018-06-16 DIAGNOSIS — Z853 Personal history of malignant neoplasm of breast: Secondary | ICD-10-CM | POA: Diagnosis not present

## 2018-06-17 DIAGNOSIS — Z90710 Acquired absence of both cervix and uterus: Secondary | ICD-10-CM | POA: Diagnosis not present

## 2018-06-17 DIAGNOSIS — Z4689 Encounter for fitting and adjustment of other specified devices: Secondary | ICD-10-CM | POA: Diagnosis not present

## 2018-06-17 DIAGNOSIS — K660 Peritoneal adhesions (postprocedural) (postinfection): Secondary | ICD-10-CM | POA: Diagnosis not present

## 2018-06-17 DIAGNOSIS — Z9049 Acquired absence of other specified parts of digestive tract: Secondary | ICD-10-CM | POA: Diagnosis not present

## 2018-06-17 DIAGNOSIS — K432 Incisional hernia without obstruction or gangrene: Secondary | ICD-10-CM | POA: Diagnosis not present

## 2018-06-17 DIAGNOSIS — R262 Difficulty in walking, not elsewhere classified: Secondary | ICD-10-CM | POA: Diagnosis not present

## 2018-06-17 DIAGNOSIS — Z853 Personal history of malignant neoplasm of breast: Secondary | ICD-10-CM | POA: Diagnosis not present

## 2018-06-18 DIAGNOSIS — Z90710 Acquired absence of both cervix and uterus: Secondary | ICD-10-CM | POA: Diagnosis not present

## 2018-06-18 DIAGNOSIS — K262 Acute duodenal ulcer with both hemorrhage and perforation: Secondary | ICD-10-CM | POA: Diagnosis not present

## 2018-06-18 DIAGNOSIS — K432 Incisional hernia without obstruction or gangrene: Secondary | ICD-10-CM | POA: Diagnosis not present

## 2018-06-18 DIAGNOSIS — Z853 Personal history of malignant neoplasm of breast: Secondary | ICD-10-CM | POA: Diagnosis not present

## 2018-06-18 DIAGNOSIS — K660 Peritoneal adhesions (postprocedural) (postinfection): Secondary | ICD-10-CM | POA: Diagnosis not present

## 2018-06-18 DIAGNOSIS — Z9049 Acquired absence of other specified parts of digestive tract: Secondary | ICD-10-CM | POA: Diagnosis not present

## 2018-06-18 DIAGNOSIS — R262 Difficulty in walking, not elsewhere classified: Secondary | ICD-10-CM | POA: Diagnosis not present

## 2018-06-25 DIAGNOSIS — F419 Anxiety disorder, unspecified: Secondary | ICD-10-CM | POA: Diagnosis not present

## 2018-06-25 DIAGNOSIS — Z299 Encounter for prophylactic measures, unspecified: Secondary | ICD-10-CM | POA: Diagnosis not present

## 2018-06-25 DIAGNOSIS — Z6830 Body mass index (BMI) 30.0-30.9, adult: Secondary | ICD-10-CM | POA: Diagnosis not present

## 2018-07-11 DIAGNOSIS — Z6830 Body mass index (BMI) 30.0-30.9, adult: Secondary | ICD-10-CM | POA: Diagnosis not present

## 2018-07-11 DIAGNOSIS — K219 Gastro-esophageal reflux disease without esophagitis: Secondary | ICD-10-CM | POA: Diagnosis not present

## 2018-07-11 DIAGNOSIS — Z299 Encounter for prophylactic measures, unspecified: Secondary | ICD-10-CM | POA: Diagnosis not present

## 2018-07-11 DIAGNOSIS — E7800 Pure hypercholesterolemia, unspecified: Secondary | ICD-10-CM | POA: Diagnosis not present

## 2018-07-17 DIAGNOSIS — Z1231 Encounter for screening mammogram for malignant neoplasm of breast: Secondary | ICD-10-CM | POA: Diagnosis not present

## 2018-07-30 DIAGNOSIS — E7800 Pure hypercholesterolemia, unspecified: Secondary | ICD-10-CM | POA: Diagnosis not present

## 2018-07-30 DIAGNOSIS — Z1211 Encounter for screening for malignant neoplasm of colon: Secondary | ICD-10-CM | POA: Diagnosis not present

## 2018-07-30 DIAGNOSIS — Z1331 Encounter for screening for depression: Secondary | ICD-10-CM | POA: Diagnosis not present

## 2018-07-30 DIAGNOSIS — Z0000 Encounter for general adult medical examination without abnormal findings: Secondary | ICD-10-CM | POA: Diagnosis not present

## 2018-07-30 DIAGNOSIS — F419 Anxiety disorder, unspecified: Secondary | ICD-10-CM | POA: Diagnosis not present

## 2018-07-30 DIAGNOSIS — R5383 Other fatigue: Secondary | ICD-10-CM | POA: Diagnosis not present

## 2018-07-30 DIAGNOSIS — Z6829 Body mass index (BMI) 29.0-29.9, adult: Secondary | ICD-10-CM | POA: Diagnosis not present

## 2018-07-30 DIAGNOSIS — Z79899 Other long term (current) drug therapy: Secondary | ICD-10-CM | POA: Diagnosis not present

## 2018-07-30 DIAGNOSIS — Z7189 Other specified counseling: Secondary | ICD-10-CM | POA: Diagnosis not present

## 2018-07-30 DIAGNOSIS — Z1339 Encounter for screening examination for other mental health and behavioral disorders: Secondary | ICD-10-CM | POA: Diagnosis not present

## 2018-07-30 DIAGNOSIS — Z299 Encounter for prophylactic measures, unspecified: Secondary | ICD-10-CM | POA: Diagnosis not present

## 2018-10-11 DIAGNOSIS — Z23 Encounter for immunization: Secondary | ICD-10-CM | POA: Diagnosis not present

## 2018-10-16 DIAGNOSIS — H61009 Unspecified perichondritis of external ear, unspecified ear: Secondary | ICD-10-CM | POA: Diagnosis not present

## 2018-10-16 DIAGNOSIS — L719 Rosacea, unspecified: Secondary | ICD-10-CM | POA: Diagnosis not present

## 2018-10-21 DIAGNOSIS — R109 Unspecified abdominal pain: Secondary | ICD-10-CM | POA: Diagnosis not present

## 2018-10-21 DIAGNOSIS — Z6830 Body mass index (BMI) 30.0-30.9, adult: Secondary | ICD-10-CM | POA: Diagnosis not present

## 2018-10-21 DIAGNOSIS — Z299 Encounter for prophylactic measures, unspecified: Secondary | ICD-10-CM | POA: Diagnosis not present

## 2018-10-27 DIAGNOSIS — K760 Fatty (change of) liver, not elsewhere classified: Secondary | ICD-10-CM | POA: Diagnosis not present

## 2018-10-27 DIAGNOSIS — I700 Atherosclerosis of aorta: Secondary | ICD-10-CM | POA: Diagnosis not present

## 2018-10-27 DIAGNOSIS — Z8719 Personal history of other diseases of the digestive system: Secondary | ICD-10-CM | POA: Diagnosis not present

## 2018-10-27 DIAGNOSIS — Z9889 Other specified postprocedural states: Secondary | ICD-10-CM | POA: Diagnosis not present

## 2018-10-27 DIAGNOSIS — K5730 Diverticulosis of large intestine without perforation or abscess without bleeding: Secondary | ICD-10-CM | POA: Diagnosis not present

## 2018-10-31 DIAGNOSIS — Z6829 Body mass index (BMI) 29.0-29.9, adult: Secondary | ICD-10-CM | POA: Diagnosis not present

## 2018-10-31 DIAGNOSIS — R109 Unspecified abdominal pain: Secondary | ICD-10-CM | POA: Diagnosis not present

## 2018-10-31 DIAGNOSIS — R1013 Epigastric pain: Secondary | ICD-10-CM | POA: Diagnosis not present

## 2018-10-31 DIAGNOSIS — R140 Abdominal distension (gaseous): Secondary | ICD-10-CM | POA: Diagnosis not present

## 2018-10-31 DIAGNOSIS — Z299 Encounter for prophylactic measures, unspecified: Secondary | ICD-10-CM | POA: Diagnosis not present

## 2018-11-06 ENCOUNTER — Other Ambulatory Visit: Payer: Self-pay

## 2018-11-06 ENCOUNTER — Ambulatory Visit (INDEPENDENT_AMBULATORY_CARE_PROVIDER_SITE_OTHER): Payer: Medicare Other | Admitting: Internal Medicine

## 2018-11-06 ENCOUNTER — Encounter (INDEPENDENT_AMBULATORY_CARE_PROVIDER_SITE_OTHER): Payer: Self-pay | Admitting: Internal Medicine

## 2018-11-06 VITALS — BP 130/79 | HR 96 | Temp 98.3°F | Ht <= 58 in | Wt 142.9 lb

## 2018-11-06 DIAGNOSIS — K219 Gastro-esophageal reflux disease without esophagitis: Secondary | ICD-10-CM

## 2018-11-06 MED ORDER — PANTOPRAZOLE SODIUM 40 MG PO TBEC: 40.0000 mg | DELAYED_RELEASE_TABLET | Freq: Every day | ORAL | 1 refills | Status: DC

## 2018-11-06 NOTE — Progress Notes (Signed)
   Subjective:    Patient ID: Melanie David, female    DOB: 1946-05-15, 73 y.o.   MRN: [id number]  HPI Referred by Dr Manuella Ghazi for epigastric pain/bloating. She says the pain is better. She has been eating pasta, plain, yogurt.  She bloats after she eats. She had hernia surgery in October of 2019.  She says she is better. She has tenderness in her epigastric region.  No trouble with her BMs.  She not on a PPI at this time.   10/27/2018 CT a abdomen/pelvis with CM: LLQ pain: Small oval area probable peritoneal fat necrosis, anterior LLQ pain. No other evidence acute. No other findings to acct for left lower quadrant pain.  Numerous colonic diverticula most along the sigmoid colon. No diverticulitis. Hepatic steatosis.   Review of Systems  Past Medical History:  Diagnosis Date  . Cancer ([place])    Left breast cancer: Lumpectomy  . Osteoporosis     Past Surgical History:  Procedure Laterality Date  . ABDOMINAL HYSTERECTOMY    . APPENDECTOMY    . BREAST SURGERY    . CATARACT EXTRACTION W/PHACO  09/22/2012   Procedure: CATARACT EXTRACTION PHACO AND INTRAOCULAR LENS PLACEMENT (IOC);  Surgeon: Tonny Branch, MD;  Location: [facility];  Service: Ophthalmology;  Laterality: Right;  CDE=13.95  . CATARACT EXTRACTION W/PHACO Left 10/06/2012   Procedure: CATARACT EXTRACTION PHACO AND INTRAOCULAR LENS PLACEMENT (IOC);  Surgeon: Tonny Branch, MD;  Location: [facility];  Service: Ophthalmology;  Laterality: Left;  CDE: 14.58  . [place] SURGERY  2010  . CHOLECYSTECTOMY    . COLONOSCOPY N/A 09/01/2015   Procedure: COLONOSCOPY;  Surgeon: Rogene Houston, MD;  [facility];  Service: Endoscopy;  Laterality: N/A;  9:30 - moved to 11:00 - Ann to notify pt  . HERNIA REPAIR      No Known Allergies  Current Outpatient Medications on File Prior to Visit  Medication Sig Dispense Refill  . acetaminophen (TYLENOL) 325 MG tablet Take 650 mg by mouth every 6 (six) hours as needed. Pain    . doxycycline (DORYX) 100 MG  EC tablet Take 100 mg by mouth daily.     No current facility-administered medications on file prior to visit.         Objective:   Physical Exam Blood pressure 130/79, pulse 96, temperature 98.3 F (36.8 C), height 4\' 10"  (1.473 m), weight 142 lb 14.4 oz (64.8 kg).  Alert and oriented. Skin warm and dry. Oral mucosa is moist.   . Sclera anicteric, conjunctivae is pink. Thyroid not enlarged. No cervical lymphadenopathy. Lungs clear. Heart regular rate and rhythm.  Abdomen is soft. Bowel sounds are positive. No hepatomegaly. No abdominal masses felt. No tenderness.  No edema to lower extremities.          Assessment & Plan:  GERD. Am going to start her Protonix 40mg  and see how she does. PR in 2 weeks.

## 2018-11-06 NOTE — Patient Instructions (Signed)
Rx for Protonix 40mg  sent to her pharmacy

## 2018-11-20 ENCOUNTER — Telehealth (INDEPENDENT_AMBULATORY_CARE_PROVIDER_SITE_OTHER): Payer: Self-pay | Admitting: Internal Medicine

## 2018-11-20 NOTE — Telephone Encounter (Signed)
Patient left message stating she just saw you on the 12th of March and the medication is not working - ph# [phone]

## 2018-11-24 ENCOUNTER — Telehealth (INDEPENDENT_AMBULATORY_CARE_PROVIDER_SITE_OTHER): Payer: Self-pay | Admitting: Internal Medicine

## 2018-11-24 DIAGNOSIS — R6881 Early satiety: Secondary | ICD-10-CM

## 2018-11-24 DIAGNOSIS — R634 Abnormal weight loss: Secondary | ICD-10-CM

## 2018-11-24 NOTE — Telephone Encounter (Signed)
[place], US abdomen

## 2018-11-24 NOTE — Telephone Encounter (Signed)
[place] sch'd 12/22/18 at 1030 (1015), npo after midnight, patient aware

## 2018-11-24 NOTE — Telephone Encounter (Signed)
States she is not better. Staying on a bland diet.  Early satiety and weight loss of 8 pounds since December. Will go ahead and get an US abdomen. If normal, may need an EGD

## 2018-12-22 ENCOUNTER — Other Ambulatory Visit: Payer: Self-pay

## 2018-12-22 ENCOUNTER — Ambulatory Visit (HOSPITAL_COMMUNITY)
Admission: RE | Admit: 2018-12-22 | Discharge: 2018-12-22 | Disposition: A | Discharge status: Home / Self Care | Payer: Medicare Other | Source: Ambulatory Visit | Attending: Internal Medicine | Admitting: Internal Medicine

## 2018-12-22 DIAGNOSIS — R6881 Early satiety: Secondary | ICD-10-CM | POA: Insufficient documentation

## 2018-12-22 DIAGNOSIS — R634 Abnormal weight loss: Secondary | ICD-10-CM | POA: Diagnosis not present

## 2018-12-22 DIAGNOSIS — K769 Liver disease, unspecified: Secondary | ICD-10-CM | POA: Diagnosis not present

## 2018-12-23 ENCOUNTER — Telehealth (INDEPENDENT_AMBULATORY_CARE_PROVIDER_SITE_OTHER): Payer: Self-pay | Admitting: Internal Medicine

## 2018-12-23 DIAGNOSIS — R1013 Epigastric pain: Secondary | ICD-10-CM

## 2018-12-23 NOTE — Telephone Encounter (Signed)
H. Pylori ordered

## 2018-12-24 ENCOUNTER — Other Ambulatory Visit (INDEPENDENT_AMBULATORY_CARE_PROVIDER_SITE_OTHER): Payer: Self-pay | Admitting: *Deleted

## 2018-12-24 DIAGNOSIS — R1013 Epigastric pain: Secondary | ICD-10-CM | POA: Diagnosis not present

## 2018-12-25 ENCOUNTER — Telehealth (INDEPENDENT_AMBULATORY_CARE_PROVIDER_SITE_OTHER): Payer: Self-pay | Admitting: Internal Medicine

## 2018-12-25 DIAGNOSIS — A048 Other specified bacterial intestinal infections: Secondary | ICD-10-CM

## 2018-12-25 LAB — H. PYLORI ANTIBODY, IGG: H. pylori, IgG AbS: 1.04 Index Value — ABNORMAL HIGH (ref 0.00–0.79)

## 2018-12-25 MED ORDER — AMOXICILLIN 500 MG PO CAPS: 1000.0000 mg | ORAL_CAPSULE | Freq: Two times a day (BID) | ORAL | 0 refills | Status: DC

## 2018-12-25 MED ORDER — BIS SUBCIT-METRONID-TETRACYC 140-125-125 MG PO CAPS: 3.0000 | ORAL_CAPSULE | Freq: Three times a day (TID) | ORAL | 0 refills | Status: DC

## 2018-12-25 MED ORDER — CLARITHROMYCIN ER 500 MG PO TB24: 1000.0000 mg | ORAL_TABLET | Freq: Two times a day (BID) | ORAL | 0 refills | Status: DC

## 2018-12-25 MED ORDER — OMEPRAZOLE 20 MG PO CPDR: 20.0000 mg | DELAYED_RELEASE_CAPSULE | Freq: Two times a day (BID) | ORAL | 0 refills | Status: DC

## 2018-12-25 NOTE — Telephone Encounter (Signed)
Rx for pylera sent to her pharmacy

## 2018-12-25 NOTE — Telephone Encounter (Signed)
Rx for Omeprazole sent to her pharmacy 

## 2018-12-25 NOTE — Telephone Encounter (Signed)
Rx for Clarithromycin 500mg  BID, Amoxicillin 1gm BID x 14 days sent to her pharmacy. insurace would not pay for the Pylera.

## 2018-12-29 ENCOUNTER — Telehealth (INDEPENDENT_AMBULATORY_CARE_PROVIDER_SITE_OTHER): Payer: Self-pay | Admitting: Internal Medicine

## 2018-12-29 DIAGNOSIS — R195 Other fecal abnormalities: Secondary | ICD-10-CM

## 2018-12-29 NOTE — Telephone Encounter (Signed)
States her stools are clay colored. Am going to get an hepatic function.

## 2018-12-30 ENCOUNTER — Telehealth (INDEPENDENT_AMBULATORY_CARE_PROVIDER_SITE_OTHER): Payer: Self-pay | Admitting: Internal Medicine

## 2018-12-30 DIAGNOSIS — R195 Other fecal abnormalities: Secondary | ICD-10-CM | POA: Diagnosis not present

## 2018-12-30 LAB — HEPATIC FUNCTION PANEL
AG Ratio: 1.6 (calc) (ref 1.0–2.5)
ALT: 11 U/L (ref 6–29)
AST: 17 U/L (ref 10–35)
Albumin: 4.1 g/dL (ref 3.6–5.1)
Alkaline phosphatase (APISO): 63 U/L (ref 37–153)
Bilirubin, Direct: 0.2 mg/dL (ref 0.0–0.2)
Globulin: 2.5 g/dL (calc) (ref 1.9–3.7)
Indirect Bilirubin: 0.4 mg/dL (calc) (ref 0.2–1.2)
Total Bilirubin: 0.6 mg/dL (ref 0.2–1.2)
Total Protein: 6.6 g/dL (ref 6.1–8.1)

## 2018-12-30 NOTE — Telephone Encounter (Signed)
Patient called stated she has had some swelling on her left side where she had a lumpectomy - please call her at [phone]

## 2018-12-31 DIAGNOSIS — R195 Other fecal abnormalities: Secondary | ICD-10-CM | POA: Diagnosis not present

## 2018-12-31 DIAGNOSIS — R109 Unspecified abdominal pain: Secondary | ICD-10-CM | POA: Diagnosis not present

## 2018-12-31 DIAGNOSIS — Z6833 Body mass index (BMI) 33.0-33.9, adult: Secondary | ICD-10-CM | POA: Diagnosis not present

## 2018-12-31 DIAGNOSIS — K219 Gastro-esophageal reflux disease without esophagitis: Secondary | ICD-10-CM | POA: Diagnosis not present

## 2018-12-31 DIAGNOSIS — F419 Anxiety disorder, unspecified: Secondary | ICD-10-CM | POA: Diagnosis not present

## 2018-12-31 DIAGNOSIS — Z299 Encounter for prophylactic measures, unspecified: Secondary | ICD-10-CM | POA: Diagnosis not present

## 2018-12-31 NOTE — Telephone Encounter (Signed)
I spoke with patient. Has had this swelling from time to time. I advised her she really needed to see her PCP concerning this. Hx of breast cancer

## 2019-01-06 ENCOUNTER — Telehealth (INDEPENDENT_AMBULATORY_CARE_PROVIDER_SITE_OTHER): Payer: Self-pay | Admitting: Internal Medicine

## 2019-01-06 ENCOUNTER — Other Ambulatory Visit (INDEPENDENT_AMBULATORY_CARE_PROVIDER_SITE_OTHER): Payer: Self-pay | Admitting: Internal Medicine

## 2019-01-06 DIAGNOSIS — R1013 Epigastric pain: Secondary | ICD-10-CM

## 2019-01-06 DIAGNOSIS — A048 Other specified bacterial intestinal infections: Secondary | ICD-10-CM

## 2019-01-06 NOTE — Telephone Encounter (Signed)
Melanie David, EGD.  Diagnosis, epigastric pain. H. Pylori.

## 2019-01-07 DIAGNOSIS — R1013 Epigastric pain: Secondary | ICD-10-CM | POA: Insufficient documentation

## 2019-01-07 DIAGNOSIS — A048 Other specified bacterial intestinal infections: Secondary | ICD-10-CM | POA: Insufficient documentation

## 2019-01-07 NOTE — Telephone Encounter (Signed)
EGD sch'd 01/14/19 at 1015, patient aware, verbal instructions given

## 2019-01-08 ENCOUNTER — Telehealth (INDEPENDENT_AMBULATORY_CARE_PROVIDER_SITE_OTHER): Payer: Self-pay | Admitting: Internal Medicine

## 2019-01-08 NOTE — Telephone Encounter (Signed)
Patient left message wanting a call back at [phone]

## 2019-01-09 ENCOUNTER — Other Ambulatory Visit (HOSPITAL_COMMUNITY)
Admission: RE | Admit: 2019-01-09 | Discharge: 2019-01-09 | Disposition: A | Discharge status: Home / Self Care | Payer: Medicare Other | Source: Ambulatory Visit | Attending: Internal Medicine | Admitting: Internal Medicine

## 2019-01-09 ENCOUNTER — Other Ambulatory Visit: Payer: Self-pay

## 2019-01-09 DIAGNOSIS — Z1159 Encounter for screening for other viral diseases: Secondary | ICD-10-CM | POA: Insufficient documentation

## 2019-01-10 LAB — NOVEL CORONAVIRUS, NAA (HOSP ORDER, SEND-OUT TO REF LAB; TAT 18-24 HRS): SARS-CoV-2, NAA: NOT DETECTED

## 2019-01-12 NOTE — Telephone Encounter (Signed)
I have spoken with patient 

## 2019-01-14 ENCOUNTER — Encounter (HOSPITAL_COMMUNITY)
Admission: RE | Disposition: A | Discharge status: Home / Self Care | Payer: Self-pay | Source: Home / Self Care | Attending: Internal Medicine

## 2019-01-14 ENCOUNTER — Ambulatory Visit (HOSPITAL_COMMUNITY)
Admission: RE | Admit: 2019-01-14 | Discharge: 2019-01-14 | Disposition: A | Discharge status: Home / Self Care | Payer: Medicare Other | Attending: Internal Medicine | Admitting: Internal Medicine

## 2019-01-14 ENCOUNTER — Other Ambulatory Visit: Payer: Self-pay

## 2019-01-14 ENCOUNTER — Encounter (HOSPITAL_COMMUNITY): Payer: Self-pay | Admitting: *Deleted

## 2019-01-14 DIAGNOSIS — R634 Abnormal weight loss: Secondary | ICD-10-CM | POA: Diagnosis not present

## 2019-01-14 DIAGNOSIS — R6881 Early satiety: Secondary | ICD-10-CM | POA: Insufficient documentation

## 2019-01-14 DIAGNOSIS — R140 Abdominal distension (gaseous): Secondary | ICD-10-CM | POA: Insufficient documentation

## 2019-01-14 DIAGNOSIS — Z853 Personal history of malignant neoplasm of breast: Secondary | ICD-10-CM | POA: Diagnosis not present

## 2019-01-14 DIAGNOSIS — K2980 Duodenitis without bleeding: Secondary | ICD-10-CM | POA: Diagnosis not present

## 2019-01-14 DIAGNOSIS — R599 Enlarged lymph nodes, unspecified: Secondary | ICD-10-CM | POA: Diagnosis not present

## 2019-01-14 DIAGNOSIS — K3189 Other diseases of stomach and duodenum: Secondary | ICD-10-CM

## 2019-01-14 DIAGNOSIS — R1013 Epigastric pain: Secondary | ICD-10-CM | POA: Diagnosis not present

## 2019-01-14 DIAGNOSIS — K449 Diaphragmatic hernia without obstruction or gangrene: Secondary | ICD-10-CM | POA: Diagnosis not present

## 2019-01-14 DIAGNOSIS — A048 Other specified bacterial intestinal infections: Secondary | ICD-10-CM

## 2019-01-14 HISTORY — PX: ESOPHAGOGASTRODUODENOSCOPY: SHX5428

## 2019-01-14 HISTORY — PX: BIOPSY: SHX5522

## 2019-01-14 SURGERY — EGD (ESOPHAGOGASTRODUODENOSCOPY)
Anesthesia: Moderate Sedation

## 2019-01-14 MED ORDER — MEPERIDINE HCL 50 MG/ML IJ SOLN
INTRAMUSCULAR | Status: AC
  Filled 2019-01-14: qty 1

## 2019-01-14 MED ORDER — LIDOCAINE VISCOUS HCL 2 % MT SOLN
OROMUCOSAL | Status: AC
  Filled 2019-01-14: qty 15

## 2019-01-14 MED ORDER — MEPERIDINE HCL 50 MG/ML IJ SOLN
INTRAMUSCULAR | Status: DC | PRN
  Administered 2019-01-14 (×2): 25 mg

## 2019-01-14 MED ORDER — MIDAZOLAM HCL 5 MG/5ML IJ SOLN
INTRAMUSCULAR | Status: DC | PRN
  Administered 2019-01-14: 2 mg via INTRAVENOUS
  Administered 2019-01-14: 1 mg via INTRAVENOUS
  Administered 2019-01-14: 2 mg via INTRAVENOUS

## 2019-01-14 MED ORDER — MIDAZOLAM HCL 5 MG/5ML IJ SOLN
INTRAMUSCULAR | Status: AC
  Filled 2019-01-14: qty 10

## 2019-01-14 MED ORDER — LIDOCAINE VISCOUS HCL 2 % MT SOLN
OROMUCOSAL | Status: DC | PRN
  Administered 2019-01-14: 1 via OROMUCOSAL

## 2019-01-14 MED ORDER — SODIUM CHLORIDE 0.9 % IV SOLN
INTRAVENOUS | Status: DC
  Administered 2019-01-14: 10:00:00 via INTRAVENOUS

## 2019-01-14 NOTE — Discharge Instructions (Signed)
No aspirin or NSAIDs for 24 hours. Resume usual medications and diet as before. No driving for 24 hours. Physician will call with biopsy results.      Upper Endoscopy, Adult, Care After This sheet gives you information about how to care for yourself after your procedure. Your health care provider may also give you more specific instructions. If you have problems or questions, contact your health care provider. What can I expect after the procedure? After the procedure, it is common to have:  A sore throat.  Mild stomach pain or discomfort.  Bloating.  Nausea. Follow these instructions at home:   Follow instructions from your health care provider about what to eat or drink after your procedure.  Return to your normal activities as told by your health care provider. Ask your health care provider what activities are safe for you.  Take over-the-counter and prescription medicines only as told by your health care provider.  Do not drive for 24 hours if you were given a sedative during your procedure.  Keep all follow-up visits as told by your health care provider. This is important. Contact a health care provider if you have:  A sore throat that lasts longer than one day.  Trouble swallowing. Get help right away if:  You vomit blood or your vomit looks like coffee grounds.  You have: ? A fever. ? Bloody, black, or tarry stools. ? A severe sore throat or you cannot swallow. ? Difficulty breathing. ? Severe pain in your chest or abdomen. Summary  After the procedure, it is common to have a sore throat, mild stomach discomfort, bloating, and nausea.  Do not drive for 24 hours if you were given a sedative during the procedure.  Follow instructions from your health care provider about what to eat or drink after your procedure.  Return to your normal activities as told by your health care provider. This information is not intended to replace advice given to you by your  health care provider. Make sure you discuss any questions you have with your health care provider. Document Released: 02/12/2012 Document Revised: 01/13/2018 Document Reviewed: 01/13/2018 Elsevier Interactive Patient Education  2019 [employer].

## 2019-01-14 NOTE — H&P (Signed)
Melanie David is an 73 y.o. female.   Chief Complaint: Patient is here for EGD. HPI: Patient is 73 year old Caucasian female who presents with a 73-month history of epigastric pain postprandial bloating as well as early satiety.  She had abdominal pelvic CT on 10/27/2018 and no abnormality was noted.  About 3 weeks ago she had abdominal ultrasound there was unremarkable.  Bile duct measured 8 mm.  She is status post cholecystectomy.  Was also found to have H. pylori gastritis based on serology and she has been treated.  She feels somewhat better.  She states she has lost 13 pounds since her symptoms started.  No history of melena nausea or vomiting.  No history of NSAID use.  Past Medical History:  Diagnosis Date  . Cancer ([place])    Left breast cancer: Lumpectomy  . Osteoporosis     Past Surgical History:  Procedure Laterality Date  . ABDOMINAL HYSTERECTOMY    . APPENDECTOMY    . BREAST SURGERY    . CATARACT EXTRACTION W/PHACO  09/22/2012   Procedure: CATARACT EXTRACTION PHACO AND INTRAOCULAR LENS PLACEMENT (IOC);  Surgeon: Tonny Branch, MD;  Location: [facility];  Service: Ophthalmology;  Laterality: Right;  CDE=13.95  . CATARACT EXTRACTION W/PHACO Left 10/06/2012   Procedure: CATARACT EXTRACTION PHACO AND INTRAOCULAR LENS PLACEMENT (IOC);  Surgeon: Tonny Branch, MD;  Location: [facility];  Service: Ophthalmology;  Laterality: Left;  CDE: 14.58  . [place] SURGERY  2010  . CHOLECYSTECTOMY    . COLONOSCOPY N/A 09/01/2015   Procedure: COLONOSCOPY;  Surgeon: Rogene Houston, MD;  [facility];  Service: Endoscopy;  Laterality: N/A;  9:30 - moved to 11:00 - Ann to notify pt  . HERNIA REPAIR      Family History  Problem Relation Age of Onset  . Heart disease Father    Social History:  reports that she has never smoked. She has never used smokeless tobacco. She reports that she does not drink alcohol or use drugs.  Allergies: No Known Allergies  Medications Prior to Admission  Medication  Sig Dispense Refill  . pantoprazole (PROTONIX) 40 MG tablet Take 1 tablet (40 mg total) by mouth daily. 30 tablet 1    No results found for this or any previous visit (from the past 48 hour(s)). No results found.  ROS  Blood pressure 128/70, pulse 81, temperature 98 F (36.7 C), temperature source Oral, resp. rate 15, height 4\' 9"  (1.448 m), weight 62.1 kg, SpO2 100 %. Physical Exam  Constitutional: She appears well-developed and well-nourished.  HENT:  Mouth/Throat: Oropharynx is clear and moist.  Eyes: Conjunctivae are normal. No scleral icterus.  Neck: No thyromegaly present.  Cardiovascular: Normal rate, regular rhythm and normal heart sounds.  No murmur heard. Respiratory: Effort normal and breath sounds normal.  GI:  Abdomen is symmetrical with multiple laparoscopy scars some of which I did left upper quadrant of her abdomen.  Abdomen is soft.  She has a mild midepigastric tenderness.  No organomegaly or masses.  Musculoskeletal:        General: No edema.  Lymphadenopathy:    She has no cervical adenopathy.  Neurological: She is alert.  Skin: Skin is warm and dry.     Assessment/Plan Epigastric pain postprandial bloating and early satiety. Weight loss. Diagnostic EGD.  Hildred Laser, MD 01/14/2019, 10:18 AM

## 2019-01-14 NOTE — Op Note (Addendum)
[facility] Patient Name: Melanie David Procedure Date: 01/14/2019 10:11 AM MRN: [id number] Date of Birth: 1946/04/27 Attending MD: Hildred Laser , MD CSN: [id number] Age: 73 Admit Type: Outpatient Procedure:                Upper GI endoscopy Indications:              Epigastric abdominal pain, Abdominal bloating,                            Early satiety Providers:                Hildred Laser, MD, Janeece Riggers, RN, Raphael Gibney,                            Technician, Randa Spike, Technician Referring MD:             Fuller Canada Manuella Ghazi, MD Medicines:                Lidocaine spray, Meperidine 50 mg IV, Midazolam 5                            mg IV Complications:            No immediate complications. Estimated Blood Loss:     Estimated blood loss was minimal. Estimated blood                            loss was minimal. Procedure:                Pre-Anesthesia Assessment:                           - Prior to the procedure, a History and Physical                            was performed, and patient medications and                            allergies were reviewed. The patient's tolerance of                            previous anesthesia was also reviewed. The risks                            and benefits of the procedure and the sedation                            options and risks were discussed with the patient.                            All questions were answered, and informed consent                            was obtained. Prior Anticoagulants: The patient has  taken no previous anticoagulant or antiplatelet                            agents. ASA Grade Assessment: II - A patient with                            mild systemic disease. After reviewing the risks                            and benefits, the patient was deemed in                            satisfactory condition to undergo the procedure.                           After obtaining informed  consent, the endoscope was                            passed under direct vision. Throughout the                            procedure, the patient's blood pressure, pulse, and                            oxygen saturations were monitored continuously. The                            GIF-H190 ([id number]) scope was introduced through the                            mouth, and advanced to the second part of duodenum.                            The upper GI endoscopy was accomplished without                            difficulty. The patient tolerated the procedure                            well. Scope In: 10:30:48 AM Scope Out: 10:40:14 AM Total Procedure Duration: 0 hours 9 minutes 26 seconds  Findings:      The examined esophagus was normal.      The Z-line was regular and was found 32 cm from the incisors.      A 3 cm hiatal hernia was present.      A healed ulcer was found in the gastric antrum.      The exam of the stomach was otherwise normal.      Diffuse nodular mucosa was found in the duodenal bulb. Biopsies were       taken with a cold forceps for histology.      The second portion of the duodenum was normal. Impression:               - Normal esophagus.                           -  Z-line regular, 32 cm from the incisors.                           - 3 cm hiatal hernia.                           - Antral scar otherwise normal stomach.                           - Nodular mucosa in the duodenal bulb. ? brunner                            gland hypertrophy. Biopsied.                           - Normal second portion of the duodenum. Moderate Sedation:      Moderate (conscious) sedation was administered by the endoscopy nurse       and supervised by the endoscopist. The following parameters were       monitored: oxygen saturation, heart rate, blood pressure, CO2       capnography and response to care. Total physician intraservice time was       14 minutes.      Moderate (conscious)  sedation was administered by the endoscopy nurse       and supervised by the endoscopist. The following parameters were       monitored: oxygen saturation, heart rate, blood pressure, CO2       capnography and response to care. Total physician intraservice time was       14 minutes. Recommendation:           - Resume previous diet today.                           - Continue present medications.                           - No aspirin, ibuprofen, naproxen, or other                            non-steroidal anti-inflammatory drugs for 1 day.                           - Await pathology results. Procedure Code(s):        --- Professional ---                           [phone], Esophagogastroduodenoscopy, flexible,                            transoral; with biopsy, single or multiple                           G0500, Moderate sedation services provided by the                            same physician or other qualified health care  professional performing a gastrointestinal                            endoscopic service that sedation supports,                            requiring the presence of an independent trained                            observer to assist in the monitoring of the                            patient's level of consciousness and physiological                            status; initial 15 minutes of intra-service time;                            patient age 52 years or older (additional time may                            be reported with [phone], as appropriate)                           G0500, Moderate sedation services provided by the                            same physician or other qualified health care                            professional performing a gastrointestinal                            endoscopic service that sedation supports,                            requiring the presence of an independent trained                            observer  to assist in the monitoring of the                            patient's level of consciousness and physiological                            status; initial 15 minutes of intra-service time;                            patient age 25 years or older (additional time may                            be reported with [phone], as appropriate) Diagnosis Code(s):        --- Professional ---  K44.9, Diaphragmatic hernia without obstruction or                            gangrene                           K31.89, Other diseases of stomach and duodenum                           R10.13, Epigastric pain                           R14.0, Abdominal distension (gaseous)                           R68.81, Early satiety CPT copyright 2019 [employer] Association. All rights reserved. The codes documented in this report are preliminary and upon coder review may  be revised to meet current compliance requirements. Hildred Laser, MD Hildred Laser, MD 01/14/2019 10:57:19 AM This report has been signed electronically. Number of Addenda: 0

## 2019-01-15 ENCOUNTER — Encounter (HOSPITAL_COMMUNITY): Payer: Self-pay | Admitting: Internal Medicine

## 2019-02-05 ENCOUNTER — Encounter (INDEPENDENT_AMBULATORY_CARE_PROVIDER_SITE_OTHER): Payer: Self-pay | Admitting: Internal Medicine

## 2019-02-05 ENCOUNTER — Ambulatory Visit (INDEPENDENT_AMBULATORY_CARE_PROVIDER_SITE_OTHER): Payer: Medicare Other | Admitting: Internal Medicine

## 2019-02-05 ENCOUNTER — Other Ambulatory Visit: Payer: Self-pay

## 2019-02-05 DIAGNOSIS — R1010 Upper abdominal pain, unspecified: Secondary | ICD-10-CM

## 2019-02-05 DIAGNOSIS — K219 Gastro-esophageal reflux disease without esophagitis: Secondary | ICD-10-CM

## 2019-02-05 DIAGNOSIS — R634 Abnormal weight loss: Secondary | ICD-10-CM | POA: Insufficient documentation

## 2019-02-05 DIAGNOSIS — R6881 Early satiety: Secondary | ICD-10-CM | POA: Diagnosis not present

## 2019-02-05 DIAGNOSIS — R109 Unspecified abdominal pain: Secondary | ICD-10-CM | POA: Insufficient documentation

## 2019-02-05 NOTE — Progress Notes (Signed)
Presenting complaint;  Follow-up for early satiety weight loss and abdominal pain.  Database sent subjective:  Patient is 73 year old Caucasian female who presented to our office on 11/08/2018 with 34-month history of abdominal pain across upper abdomen bloating and early satiety.  She reported weight loss of 8 pounds in the preceding 3 months.  She recalls she weighed 150 pounds in December 2019.  Prior to her office visit she had abdominal pelvic CT which did not reveal any abnormality to account for her symptoms.  Her H. pylori serology was positive and she was treated with clarithromycin amoxicillin for 14 days along with a PPI.  Once again there was no symptomatic improvement. She underwent esophagogastroduodenoscopy on 01/14/2019 revealing small sliding hiatal hernia, antral scar and nodular bulbar mucosa and biopsy only revealed peptic duodenitis. All in all she has lost 15 pounds in the last 6 months.  She only has lost 7 pounds since her office visit 3 months ago. She continues to complain of mild epigastric pain fullness under both costal margins after eating and she gets felt up after eating very small meals.  She has kept food diary since May 22. Her breakfast generally consist of coffee slice of peanut butter wheat toast and she gets full after eating this much breakfast.. She may have a small serving of sliced peaches as a snack and lunch again is very small.  She may have one third of chicken thigh 1/3 cup of coleslaw and 3 Brussels sprouts and again she has felt. Similarly she eats very small supper for example she had half of her face light 1/3 cup of slough and one half puppy at dinner on May 22. She does not feel the pantoprazole is helping.  She is using it on as-needed basis.  At times she feel fullness in left intrascapular region and she wonders if this is related to prior surgery for breast carcinoma.  She wonders if she has intermittent lymphedema.  Her bowels move daily.  She  denies melena or rectal bleeding.  She also does not suffer heartburn whether or not she takes pantoprazole. She had laparoscopic repair of ventral hernia with mesh by Dr. Ladona Horns in October last year.   Current Medications: Outpatient Encounter Medications as of 02/05/2019  Medication Sig  . acyclovir (ZOVIRAX) 400 MG tablet Take 400 mg by mouth 2 (two) times daily.   . pantoprazole (PROTONIX) 40 MG tablet Take 1 tablet (40 mg total) by mouth daily.   No facility-administered encounter medications on file as of 02/05/2019.      Objective: Blood pressure (!) 145/77, pulse 75, temperature 98.3 F (36.8 C), temperature source Oral, resp. rate 18, height 4\' 9"  (1.448 m), weight 135 lb 14.4 oz (61.6 kg). Patient is alert and in no acute distress. Conjunctiva is pink. Sclera is nonicteric Oropharyngeal mucosa is normal. No neck masses or thyromegaly noted. Cardiac exam with regular rhythm normal S1 and S2. No murmur or gallop noted. Lungs are clear to auscultation. Abdomen abdomen is symmetrical.  She has laparoscopy scars and left mid abdomen/flank.  Abdomen is soft with mild tenderness in epigastric region.  There is fullness in supraumbilical region felt to be due to mesh.  No organomegaly or masses. No LE edema or clubbing noted.  Labs/studies Results: CT images reviewed from October 27, 2018, Jan 09, 2017 in March 2012. On all of these images her stomach appears to be small.  Ultrasound on 12/22/2018 revealed bile duct of 8 mm mild liver echogenicity but  no other abnormalities.  Assessment:  Patient with postprandial epigastric pain bloating early satiety and of 15 pound weight loss over a period of 6 months.  CT and ultrasound unremarkable.  She has not responded to therapy for H. pylori gastritis as well as PPI.  EGD about 7 weeks ago did not reveal any abnormality to account for her symptoms. On CT her stomach appears to be small but unchanged from prior study of March 2012.  I wonder  if she has developed gastric noncompliance.  Malignancy is not in the differential diagnosis based on endoscopic and CT findings. Will examine her stomach with upper GI series and also look for less common conditions which can impair gastric adaptation. If further testing is unremarkable would consider trial with antispasmodic before doing gastric EUS.   Plan:  Patient can use pantoprazole on as-needed basis. We will schedule her for upper GI series. Patient will go to the lab for CBC with differential, sed rate and angiotensin converting enzyme. Further recommendations to follow. Office visit in 3 months.

## 2019-02-05 NOTE — Patient Instructions (Signed)
Physician will call with results of upper GI series and blood work when completed.

## 2019-02-06 DIAGNOSIS — R6881 Early satiety: Secondary | ICD-10-CM | POA: Diagnosis not present

## 2019-02-06 DIAGNOSIS — R634 Abnormal weight loss: Secondary | ICD-10-CM | POA: Diagnosis not present

## 2019-02-09 ENCOUNTER — Other Ambulatory Visit (INDEPENDENT_AMBULATORY_CARE_PROVIDER_SITE_OTHER): Payer: Self-pay | Admitting: *Deleted

## 2019-02-09 ENCOUNTER — Telehealth (INDEPENDENT_AMBULATORY_CARE_PROVIDER_SITE_OTHER): Payer: Self-pay | Admitting: *Deleted

## 2019-02-09 DIAGNOSIS — R6881 Early satiety: Secondary | ICD-10-CM

## 2019-02-09 DIAGNOSIS — R634 Abnormal weight loss: Secondary | ICD-10-CM

## 2019-02-09 DIAGNOSIS — R109 Unspecified abdominal pain: Secondary | ICD-10-CM

## 2019-02-09 LAB — CBC WITH DIFFERENTIAL/PLATELET
Absolute Monocytes: 291 cells/uL (ref 200–950)
Basophils Absolute: 50 cells/uL (ref 0–200)
Basophils Relative: 1.8 %
Eosinophils Absolute: 70 cells/uL (ref 15–500)
Eosinophils Relative: 2.5 %
HCT: 44.1 % (ref 35.0–45.0)
Hemoglobin: 14.7 g/dL (ref 11.7–15.5)
Lymphs Abs: 815 cells/uL — ABNORMAL LOW (ref 850–3900)
MCH: 28.3 pg (ref 27.0–33.0)
MCHC: 33.3 g/dL (ref 32.0–36.0)
MCV: 84.8 fL (ref 80.0–100.0)
MPV: 11.1 fL (ref 7.5–12.5)
Monocytes Relative: 10.4 %
Neutro Abs: 1574 cells/uL (ref 1500–7800)
Neutrophils Relative %: 56.2 %
Platelets: 259 10*3/uL (ref 140–400)
RBC: 5.2 10*6/uL — ABNORMAL HIGH (ref 3.80–5.10)
RDW: 13.6 % (ref 11.0–15.0)
Total Lymphocyte: 29.1 %
WBC: 2.8 10*3/uL — ABNORMAL LOW (ref 3.8–10.8)

## 2019-02-09 LAB — SEDIMENTATION RATE: Sed Rate: 9 mm/h (ref 0–30)

## 2019-02-09 LAB — ANGIOTENSIN CONVERTING ENZYME: Angiotensin-Converting Enzyme: 22 U/L (ref 9–67)

## 2019-02-09 NOTE — Telephone Encounter (Signed)
Dr.Rehman had request that the patient have a UGI with Dr.Boles. This study has been arranged for June 22 (Monday) @ 10 am. Patient has been contacted and made aware that she will need to be there at 10:15 , and she cannot have anything to eat or drink after midnight the night before.

## 2019-02-09 NOTE — Progress Notes (Signed)
Patient to be scheduled

## 2019-02-12 ENCOUNTER — Other Ambulatory Visit (INDEPENDENT_AMBULATORY_CARE_PROVIDER_SITE_OTHER): Payer: Self-pay | Admitting: *Deleted

## 2019-02-12 DIAGNOSIS — R634 Abnormal weight loss: Secondary | ICD-10-CM

## 2019-02-12 DIAGNOSIS — R109 Unspecified abdominal pain: Secondary | ICD-10-CM

## 2019-02-16 ENCOUNTER — Ambulatory Visit (HOSPITAL_COMMUNITY)
Admission: RE | Admit: 2019-02-16 | Discharge: 2019-02-16 | Disposition: A | Discharge status: Home / Self Care | Payer: Medicare Other | Source: Ambulatory Visit | Attending: Internal Medicine | Admitting: Internal Medicine

## 2019-02-16 ENCOUNTER — Other Ambulatory Visit: Payer: Self-pay

## 2019-02-16 DIAGNOSIS — R6881 Early satiety: Secondary | ICD-10-CM

## 2019-02-16 DIAGNOSIS — R1010 Upper abdominal pain, unspecified: Secondary | ICD-10-CM | POA: Insufficient documentation

## 2019-02-16 DIAGNOSIS — R634 Abnormal weight loss: Secondary | ICD-10-CM | POA: Diagnosis not present

## 2019-02-17 ENCOUNTER — Other Ambulatory Visit (INDEPENDENT_AMBULATORY_CARE_PROVIDER_SITE_OTHER): Payer: Self-pay | Admitting: *Deleted

## 2019-02-17 ENCOUNTER — Other Ambulatory Visit (INDEPENDENT_AMBULATORY_CARE_PROVIDER_SITE_OTHER): Payer: Self-pay | Admitting: Internal Medicine

## 2019-02-17 DIAGNOSIS — Z299 Encounter for prophylactic measures, unspecified: Secondary | ICD-10-CM | POA: Diagnosis not present

## 2019-02-17 DIAGNOSIS — Z0389 Encounter for observation for other suspected diseases and conditions ruled out: Secondary | ICD-10-CM | POA: Diagnosis not present

## 2019-02-17 DIAGNOSIS — R222 Localized swelling, mass and lump, trunk: Secondary | ICD-10-CM | POA: Diagnosis not present

## 2019-02-17 DIAGNOSIS — R634 Abnormal weight loss: Secondary | ICD-10-CM | POA: Diagnosis not present

## 2019-02-17 DIAGNOSIS — Z713 Dietary counseling and surveillance: Secondary | ICD-10-CM | POA: Diagnosis not present

## 2019-02-17 DIAGNOSIS — R0789 Other chest pain: Secondary | ICD-10-CM | POA: Diagnosis not present

## 2019-02-17 DIAGNOSIS — R0781 Pleurodynia: Secondary | ICD-10-CM | POA: Diagnosis not present

## 2019-02-17 DIAGNOSIS — Z6827 Body mass index (BMI) 27.0-27.9, adult: Secondary | ICD-10-CM | POA: Diagnosis not present

## 2019-02-17 DIAGNOSIS — Z789 Other specified health status: Secondary | ICD-10-CM | POA: Diagnosis not present

## 2019-02-17 DIAGNOSIS — F419 Anxiety disorder, unspecified: Secondary | ICD-10-CM | POA: Diagnosis not present

## 2019-02-17 DIAGNOSIS — R109 Unspecified abdominal pain: Secondary | ICD-10-CM | POA: Diagnosis not present

## 2019-02-17 LAB — CBC WITH DIFFERENTIAL/PLATELET
Absolute Monocytes: 308 cells/uL (ref 200–950)
Basophils Absolute: 28 cells/uL (ref 0–200)
Basophils Relative: 0.8 %
Eosinophils Absolute: 49 cells/uL (ref 15–500)
Eosinophils Relative: 1.4 %
HCT: 44 % (ref 35.0–45.0)
Hemoglobin: 14.6 g/dL (ref 11.7–15.5)
Lymphs Abs: 721 cells/uL — ABNORMAL LOW (ref 850–3900)
MCH: 28.1 pg (ref 27.0–33.0)
MCHC: 33.2 g/dL (ref 32.0–36.0)
MCV: 84.8 fL (ref 80.0–100.0)
MPV: 10.8 fL (ref 7.5–12.5)
Monocytes Relative: 8.8 %
Neutro Abs: 2394 cells/uL (ref 1500–7800)
Neutrophils Relative %: 68.4 %
Platelets: 263 10*3/uL (ref 140–400)
RBC: 5.19 10*6/uL — ABNORMAL HIGH (ref 3.80–5.10)
RDW: 13.8 % (ref 11.0–15.0)
Total Lymphocyte: 20.6 %
WBC: 3.5 10*3/uL — ABNORMAL LOW (ref 3.8–10.8)

## 2019-02-17 MED ORDER — DICYCLOMINE HCL 10 MG PO CAPS: 10.0000 mg | ORAL_CAPSULE | Freq: Two times a day (BID) | ORAL | 0 refills | Status: DC

## 2019-02-24 ENCOUNTER — Other Ambulatory Visit (INDEPENDENT_AMBULATORY_CARE_PROVIDER_SITE_OTHER): Payer: Self-pay | Admitting: *Deleted

## 2019-02-24 DIAGNOSIS — R1013 Epigastric pain: Secondary | ICD-10-CM

## 2019-02-24 DIAGNOSIS — G8929 Other chronic pain: Secondary | ICD-10-CM

## 2019-02-24 DIAGNOSIS — A048 Other specified bacterial intestinal infections: Secondary | ICD-10-CM

## 2019-02-24 DIAGNOSIS — R634 Abnormal weight loss: Secondary | ICD-10-CM

## 2019-03-19 ENCOUNTER — Other Ambulatory Visit (INDEPENDENT_AMBULATORY_CARE_PROVIDER_SITE_OTHER): Payer: Self-pay | Admitting: *Deleted

## 2019-03-19 DIAGNOSIS — R1084 Generalized abdominal pain: Secondary | ICD-10-CM

## 2019-03-19 DIAGNOSIS — A048 Other specified bacterial intestinal infections: Secondary | ICD-10-CM

## 2019-03-19 MED ORDER — DICYCLOMINE HCL 10 MG PO CAPS: 10.0000 mg | ORAL_CAPSULE | Freq: Two times a day (BID) | ORAL | 5 refills | Status: DC

## 2019-03-19 NOTE — Progress Notes (Signed)
stool

## 2019-03-23 DIAGNOSIS — A048 Other specified bacterial intestinal infections: Secondary | ICD-10-CM | POA: Diagnosis not present

## 2019-03-24 LAB — HELICOBACTER PYLORI  SPECIAL ANTIGEN
MICRO NUMBER:: 707185
SPECIMEN QUALITY: ADEQUATE

## 2019-05-11 DIAGNOSIS — J320 Chronic maxillary sinusitis: Secondary | ICD-10-CM | POA: Diagnosis not present

## 2019-05-11 DIAGNOSIS — Z713 Dietary counseling and surveillance: Secondary | ICD-10-CM | POA: Diagnosis not present

## 2019-05-11 DIAGNOSIS — Z299 Encounter for prophylactic measures, unspecified: Secondary | ICD-10-CM | POA: Diagnosis not present

## 2019-05-11 DIAGNOSIS — Z789 Other specified health status: Secondary | ICD-10-CM | POA: Diagnosis not present

## 2019-05-11 DIAGNOSIS — Z6827 Body mass index (BMI) 27.0-27.9, adult: Secondary | ICD-10-CM | POA: Diagnosis not present

## 2019-06-15 DIAGNOSIS — Z23 Encounter for immunization: Secondary | ICD-10-CM | POA: Diagnosis not present

## 2019-07-15 DIAGNOSIS — Z299 Encounter for prophylactic measures, unspecified: Secondary | ICD-10-CM | POA: Diagnosis not present

## 2019-07-15 DIAGNOSIS — M25511 Pain in right shoulder: Secondary | ICD-10-CM | POA: Diagnosis not present

## 2019-07-15 DIAGNOSIS — N644 Mastodynia: Secondary | ICD-10-CM | POA: Diagnosis not present

## 2019-07-15 DIAGNOSIS — Z6828 Body mass index (BMI) 28.0-28.9, adult: Secondary | ICD-10-CM | POA: Diagnosis not present

## 2019-07-15 DIAGNOSIS — Z853 Personal history of malignant neoplasm of breast: Secondary | ICD-10-CM | POA: Diagnosis not present

## 2019-07-15 DIAGNOSIS — R222 Localized swelling, mass and lump, trunk: Secondary | ICD-10-CM | POA: Diagnosis not present

## 2019-07-15 DIAGNOSIS — R0781 Pleurodynia: Secondary | ICD-10-CM | POA: Diagnosis not present

## 2019-07-20 DIAGNOSIS — Z299 Encounter for prophylactic measures, unspecified: Secondary | ICD-10-CM | POA: Diagnosis not present

## 2019-07-20 DIAGNOSIS — R0781 Pleurodynia: Secondary | ICD-10-CM | POA: Diagnosis not present

## 2019-07-20 DIAGNOSIS — M7551 Bursitis of right shoulder: Secondary | ICD-10-CM | POA: Diagnosis not present

## 2019-07-20 DIAGNOSIS — M25511 Pain in right shoulder: Secondary | ICD-10-CM | POA: Diagnosis not present

## 2019-07-20 DIAGNOSIS — Z6828 Body mass index (BMI) 28.0-28.9, adult: Secondary | ICD-10-CM | POA: Diagnosis not present

## 2019-08-05 DIAGNOSIS — Z853 Personal history of malignant neoplasm of breast: Secondary | ICD-10-CM | POA: Diagnosis not present

## 2019-08-05 DIAGNOSIS — N644 Mastodynia: Secondary | ICD-10-CM | POA: Diagnosis not present

## 2019-08-06 DIAGNOSIS — R5383 Other fatigue: Secondary | ICD-10-CM | POA: Diagnosis not present

## 2019-08-06 DIAGNOSIS — Z79899 Other long term (current) drug therapy: Secondary | ICD-10-CM | POA: Diagnosis not present

## 2019-08-06 DIAGNOSIS — F419 Anxiety disorder, unspecified: Secondary | ICD-10-CM | POA: Diagnosis not present

## 2019-08-06 DIAGNOSIS — E7800 Pure hypercholesterolemia, unspecified: Secondary | ICD-10-CM | POA: Diagnosis not present

## 2019-09-14 ENCOUNTER — Telehealth (INDEPENDENT_AMBULATORY_CARE_PROVIDER_SITE_OTHER): Payer: Self-pay | Admitting: Gastroenterology

## 2019-09-14 NOTE — Telephone Encounter (Signed)
Looks like her appointment got moved up to Wednesday of this week.  We will discuss at that visit

## 2019-09-14 NOTE — Telephone Encounter (Signed)
Patient left voice mail message stating she thinks she has Hpylori infection and wants to know if labs can be ordered - she has an appointment on 2/1 - please advise - [phone]

## 2019-09-16 ENCOUNTER — Ambulatory Visit (INDEPENDENT_AMBULATORY_CARE_PROVIDER_SITE_OTHER): Payer: Medicare Other | Admitting: Gastroenterology

## 2019-09-16 ENCOUNTER — Other Ambulatory Visit: Payer: Self-pay

## 2019-09-16 ENCOUNTER — Encounter (INDEPENDENT_AMBULATORY_CARE_PROVIDER_SITE_OTHER): Payer: Self-pay | Admitting: Gastroenterology

## 2019-09-16 VITALS — BP 130/74 | HR 87 | Temp 97.3°F | Ht <= 58 in | Wt 139.3 lb

## 2019-09-16 DIAGNOSIS — H40003 Preglaucoma, unspecified, bilateral: Secondary | ICD-10-CM | POA: Diagnosis not present

## 2019-09-16 DIAGNOSIS — R1013 Epigastric pain: Secondary | ICD-10-CM

## 2019-09-16 DIAGNOSIS — Z8619 Personal history of other infectious and parasitic diseases: Secondary | ICD-10-CM | POA: Diagnosis not present

## 2019-09-16 LAB — COMPLETE METABOLIC PANEL WITH GFR
AG Ratio: 1.7 (calc) (ref 1.0–2.5)
ALT: 12 U/L (ref 6–29)
AST: 16 U/L (ref 10–35)
Albumin: 4.2 g/dL (ref 3.6–5.1)
Alkaline phosphatase (APISO): 62 U/L (ref 37–153)
BUN: 12 mg/dL (ref 7–25)
CO2: 30 mmol/L (ref 20–32)
Calcium: 9.8 mg/dL (ref 8.6–10.4)
Chloride: 103 mmol/L (ref 98–110)
Creat: 0.71 mg/dL (ref 0.60–0.93)
GFR, Est African American: 97 mL/min/{1.73_m2} (ref >=60)
GFR, Est Non African American: 83 mL/min/{1.73_m2} (ref >=60)
Globulin: 2.5 g/dL (calc) (ref 1.9–3.7)
Glucose, Bld: 91 mg/dL (ref 65–99)
Potassium: 4.2 mmol/L (ref 3.5–5.3)
Sodium: 141 mmol/L (ref 135–146)
Total Bilirubin: 0.5 mg/dL (ref 0.2–1.2)
Total Protein: 6.7 g/dL (ref 6.1–8.1)

## 2019-09-16 LAB — LIPASE: Lipase: 25 U/L (ref 7–60)

## 2019-09-16 LAB — CBC WITH DIFFERENTIAL/PLATELET
Absolute Monocytes: 312 cells/uL (ref 200–950)
Basophils Absolute: 40 cells/uL (ref 0–200)
Basophils Relative: 1 %
Eosinophils Absolute: 20 cells/uL (ref 15–500)
Eosinophils Relative: 0.5 %
HCT: 46 % — ABNORMAL HIGH (ref 35.0–45.0)
Hemoglobin: 15.4 g/dL (ref 11.7–15.5)
Lymphs Abs: 584 cells/uL — ABNORMAL LOW (ref 850–3900)
MCH: 29.2 pg (ref 27.0–33.0)
MCHC: 33.5 g/dL (ref 32.0–36.0)
MCV: 87.1 fL (ref 80.0–100.0)
MPV: 10.5 fL (ref 7.5–12.5)
Monocytes Relative: 7.8 %
Neutro Abs: 3044 cells/uL (ref 1500–7800)
Neutrophils Relative %: 76.1 %
Platelets: 269 10*3/uL (ref 140–400)
RBC: 5.28 10*6/uL — ABNORMAL HIGH (ref 3.80–5.10)
RDW: 12.4 % (ref 11.0–15.0)
Total Lymphocyte: 14.6 %
WBC: 4 10*3/uL (ref 3.8–10.8)

## 2019-09-16 MED ORDER — SUCRALFATE 1 G PO TABS: 1.0000 g | ORAL_TABLET | Freq: Two times a day (BID) | ORAL | 3 refills | Status: DC

## 2019-09-16 NOTE — Progress Notes (Signed)
Patient profile: Melanie David is a 74 y.o. female seen for evaluation of abdominal pain and h pylori. Last seen in clinic Jun 2020 for wt loss & abd pain. She was treated last year for positive h pylori 11/2018. Had endoscopy, ultrasound, and CT for evaluation of symptoms in 2020.   History of Present Illness: Melanie David  reports she was feeling some better after her evaluation last year but around Christmas 2020 she developed worsening epigastric pain.  Notes that she started eating a lot of chocolate and wonders if this contributed.  She has tried decreasing her coffee which helps some but continues to have significant soreness & burning in her epigastric area.  She reports trying Bentyl and Protonix without any improvement.  Feels symptoms seem similar to when she had H. pylori last spring and got significantly better w/ treatment. She denies any GERD symptoms. Burning localized to epigastric area.  She denies any nausea vomiting.  No dysphagia.  Eating does make the pain worse.  Has a limited oral intake of peanut butter & honey toast for breakfast, peaches and cottage cheese for lunch, few bites of dinner.  Her weight has stayed relatively stable recently as below.  She does not smoke, drink alcohol or use NSAIDs.  She takes no medications regularly.  She has no lower GI symptoms-she has a bowel movement daily.  She denies any rectal bleeding, melena, constipation diarrhea.  Wt Readings from Last 3 Encounters:  09/16/19 139 lb 4.8 oz (63.2 kg)  02/05/19 135 lb 14.4 oz (61.6 kg)  01/14/19 137 lb (62.1 kg)     Last Colonoscopy: 08/2015 Prep excellent. 4 mm polyp cold snared from proximal transverse colon. Altered will diverticula noted at sigmoid colon some of which were large. Normal rectal mucosa. Small hemorrhoids below the dentate line Path - tubular adenoma, 5 year repeat recommended.     Last Endoscopy: EGD 12/2018-small 3cm sliding hiatal hernia, healed ulcer in gastric antrum,  and nodular bulbar mucosa and biopsy only revealed peptic duodenitis.   Past Medical History:  Past Medical History:  Diagnosis Date  . Cancer ([place])    Left breast cancer: Lumpectomy  . Osteoporosis     Problem List: Patient Active Problem List   Diagnosis Date Noted  . Early satiety 02/05/2019  . Loss of weight 02/05/2019  . AP (abdominal pain) 02/05/2019  . Abdominal pain, epigastric 01/07/2019  . H. pylori infection 01/07/2019  . Dizziness 01/14/2017  . Neck pain 01/14/2017  . Neuropathy 01/14/2017  . Ventral hernia 03/13/2011    Past Surgical History: Past Surgical History:  Procedure Laterality Date  . ABDOMINAL HYSTERECTOMY    . APPENDECTOMY    . BIOPSY  01/14/2019   Procedure: BIOPSY;  Surgeon: Rogene Houston, MD;  Location: [facility];  Service: Endoscopy;;  . BREAST SURGERY    . CATARACT EXTRACTION W/PHACO  09/22/2012   Procedure: CATARACT EXTRACTION PHACO AND INTRAOCULAR LENS PLACEMENT (IOC);  Surgeon: Tonny Branch, MD;  Location: [facility] OR[facility];  Service: Ophthalmology;  Laterality: Right;  CDE=13.95  . CATARACT EXTRACTION W/PHACO Left 10/06/2012   Procedure: CATARACT EXTRACTION PHACO AND INTRAOCULAR LENS PLACEMENT (IOC);  Surgeon: Tonny Branch, MD;  Location: [facility];  Service: Ophthalmology;  Laterality: Left;  CDE: 14.58  . [place] SURGERY  2010  . CHOLECYSTECTOMY    . COLONOSCOPY N/A 09/01/2015   Procedure: COLONOSCOPY;  Surgeon: Rogene Houston, MD;  Location: [facility];  Service: Endoscopy;  Laterality: N/A;  9:30 - moved to 11:00 Lelon Frohlich to notify pt  . ESOPHAGOGASTRODUODENOSCOPY N/A 01/14/2019   Procedure: ESOPHAGOGASTRODUODENOSCOPY (EGD);  Surgeon: Rogene Houston, MD;  Location: [facility];  Service: Endoscopy;  Laterality: N/A;  . HERNIA REPAIR      Allergies: No Known Allergies    Home Medications:  Current Outpatient Medications:  .  acyclovir (ZOVIRAX) 400 MG tablet, Take 400 mg by mouth 2 (two) times daily. , Disp: , Rfl:  .  dicyclomine  (BENTYL) 10 MG capsule, Take 1 capsule (10 mg total) by mouth 2 (two) times daily before a meal. Breakfast and Supper (Patient not taking: Reported on 09/16/2019), Disp: 60 capsule, Rfl: 5 .  pantoprazole (PROTONIX) 40 MG tablet, Take 1 tablet (40 mg total) by mouth daily as needed., Disp: 30 tablet, Rfl: 1   Family History: family history includes Heart disease in her father.    Social History:   reports that she has never smoked. She has never used smokeless tobacco. She reports that she does not drink alcohol or use drugs.   Review of Systems: Constitutional: Denies weight loss/weight gain  Eyes: No changes in vision. ENT: No oral lesions, sore throat.  GI: see HPI.  Heme/Lymph: No easy bruising.  CV: No chest pain.  GU: No hematuria.  Integumentary: No rashes.  Neuro: No headaches.  Psych: No depression/anxiety.  Endocrine: No heat/cold intolerance.  Allergic/Immunologic: No urticaria.  Resp: No cough, SOB.  Musculoskeletal: No joint swelling.    Physical Examination: BP 130/74 (BP Location: Right Arm, Patient Position: Sitting, Cuff Size: Large)   Pulse 87   Temp (!) 97.3 F (36.3 C) (Temporal)   Ht 4\' 9"  (1.448 m)   Wt 139 lb 4.8 oz (63.2 kg)   BMI 30.14 kg/m  Gen: NAD, alert and oriented x 4 HEENT: PEERLA, EOMI, Neck: supple, no JVD Chest: CTA bilaterally, no wheezes, crackles, or other adventitious sounds CV: RRR, no m/g/c/r Abd: soft, NT, ND, +BS in all four quadrants; no HSM, guarding, ridigity, or rebound tenderness Ext: no edema, well perfused with 2+ pulses, Skin: no rash or lesions noted on observed skin Lymph: no noted LAD  Data:  UGI series 01/2019-IMPRESSION: Unremarkable upper GI exam.  Abd [place] 11/2018-IMPRESSION: Postop cholecystectomy without biliary dilatation. Liver shows mild increased echogenicity suggesting fatty infiltration.  CT 10/2018 a/p - unremarkable   03/23/2019-H pylori stool antigen not detected   01/2019--cbc w/ wbc 3.5, lymphs  721, otw normal, ace normal, sed rate normal  11/2018-H pylori IgG Abs positive    Assessment/Plan: [name] is a 74 y.o. female  There are no diagnoses linked to this encounter.    1.  Epigastric pain-she reports feeling symptoms seem similar to when she had H. pylori in April 2020, she was treated with triple therapy and had stool antigen confirmation of eradication testing July 2020.  Now she has had 1 month of worsening abdominal pain-we will recheck basic labs as above.  We will also repeat stool antigen testing at her request.  She has not been on PPI recently.  Has used Protonix in the past and did not feel it helped.  She has also tried dicyclomine with no improvement.  We will give her a trial of Carafate to see if this will help her symptoms.  She is compliant with diet modifications and decreasing coffee did help symptoms.    2.  History of colon polyps-due for repeat colonoscopy 2022   Office follow-up depending on lab results  and response to treatment.  >25 minutes total pt care time >50% face to face   I personally performed the service, non-incident to. (WP)  Laurine Blazer, [facility]

## 2019-09-16 NOTE — Patient Instructions (Signed)
We are checking labs today - we will call with results. Try carafate - this can help coat stomach if gastritis or ulcers causing symptoms.

## 2019-09-17 LAB — HELICOBACTER PYLORI  SPECIAL ANTIGEN
MICRO NUMBER:: 10062858
SPECIMEN QUALITY: ADEQUATE

## 2019-09-22 ENCOUNTER — Telehealth (INDEPENDENT_AMBULATORY_CARE_PROVIDER_SITE_OTHER): Payer: Self-pay | Admitting: *Deleted

## 2019-09-22 NOTE — Telephone Encounter (Signed)
Spoke with patient--feeling a little better.  Read to her the instructions that it could take a week for meds for her to feel better.  She could increase to 4 times a day before meals.  She is to let us know if something should change or if not better by next week --we may then proceed with CT if not better   She will let us know

## 2019-09-24 ENCOUNTER — Telehealth (INDEPENDENT_AMBULATORY_CARE_PROVIDER_SITE_OTHER): Payer: Self-pay | Admitting: Gastroenterology

## 2019-09-24 NOTE — Telephone Encounter (Signed)
Patient called in progress report - stated she is improving - her abdomen is very tender - she has a lot of gas under her ribs and some bloating - please advise - [phone]

## 2019-09-24 NOTE — Telephone Encounter (Signed)
I would recommend if she is improving some with the Carafate to continue for an additional week.  Dr. Laural Golden suggested a CT scan if she continues to have symptoms with the Carafate-she can let us know again a progress report next week. Thanks.

## 2019-09-25 NOTE — Telephone Encounter (Signed)
Patient was called and given Janice's recommendation. She will call the office Tuesday or Wednesday of next week with another progress report , she is going to take it 3 times daily.

## 2019-09-28 ENCOUNTER — Ambulatory Visit (INDEPENDENT_AMBULATORY_CARE_PROVIDER_SITE_OTHER): Payer: Medicare Other | Admitting: Gastroenterology

## 2019-09-29 ENCOUNTER — Telehealth (INDEPENDENT_AMBULATORY_CARE_PROVIDER_SITE_OTHER): Payer: Self-pay | Admitting: Gastroenterology

## 2019-09-29 DIAGNOSIS — R109 Unspecified abdominal pain: Secondary | ICD-10-CM

## 2019-09-29 NOTE — Telephone Encounter (Signed)
CT sch'd 10/14/19 at 400 (345), npo 4 hrs, pick up contrast, patient aware

## 2019-09-29 NOTE — Telephone Encounter (Signed)
Dr Laural Golden recommended a CT scan if symptoms were not resolved w/ Carafate, I will place an order in chart to be scheduled when available.

## 2019-09-29 NOTE — Telephone Encounter (Signed)
Patient called left progress report stating her stomach is not as sore but she can only eat a few bites before she starts feeling bloated

## 2019-09-30 ENCOUNTER — Other Ambulatory Visit (INDEPENDENT_AMBULATORY_CARE_PROVIDER_SITE_OTHER): Payer: Self-pay | Admitting: Internal Medicine

## 2019-09-30 DIAGNOSIS — R1084 Generalized abdominal pain: Secondary | ICD-10-CM

## 2019-10-08 ENCOUNTER — Telehealth (INDEPENDENT_AMBULATORY_CARE_PROVIDER_SITE_OTHER): Payer: Self-pay | Admitting: Gastroenterology

## 2019-10-08 DIAGNOSIS — K219 Gastro-esophageal reflux disease without esophagitis: Secondary | ICD-10-CM

## 2019-10-08 DIAGNOSIS — Z23 Encounter for immunization: Secondary | ICD-10-CM | POA: Diagnosis not present

## 2019-10-08 MED ORDER — PANTOPRAZOLE SODIUM 40 MG PO TBEC: 40.0000 mg | DELAYED_RELEASE_TABLET | Freq: Every day | ORAL | 1 refills | Status: DC

## 2019-10-08 NOTE — Telephone Encounter (Signed)
Patient called regarding a couple of prescription refills - please advise - ph# [phone]

## 2019-10-08 NOTE — Telephone Encounter (Signed)
I sent refill of Protonix to her pharmacy.  It looks like she should have plenty of Bentyl and Carafate.  Please let me know if she needs any other refills.

## 2019-10-14 ENCOUNTER — Ambulatory Visit (HOSPITAL_COMMUNITY)
Admission: RE | Admit: 2019-10-14 | Discharge: 2019-10-14 | Disposition: A | Discharge status: Home / Self Care | Payer: Medicare Other | Source: Ambulatory Visit | Attending: Gastroenterology | Admitting: Gastroenterology

## 2019-10-14 ENCOUNTER — Other Ambulatory Visit: Payer: Self-pay

## 2019-10-14 DIAGNOSIS — R109 Unspecified abdominal pain: Secondary | ICD-10-CM | POA: Diagnosis not present

## 2019-10-14 DIAGNOSIS — K5730 Diverticulosis of large intestine without perforation or abscess without bleeding: Secondary | ICD-10-CM | POA: Diagnosis not present

## 2019-10-14 MED ORDER — IOHEXOL 300 MG/ML  SOLN
100.0000 mL | Freq: Once | INTRAMUSCULAR | Status: AC | PRN
  Administered 2019-10-14: 100 mL via INTRAVENOUS

## 2019-10-14 NOTE — Telephone Encounter (Signed)
Patient will let us know if she needs any other prescriptions.

## 2019-10-27 DIAGNOSIS — Z79899 Other long term (current) drug therapy: Secondary | ICD-10-CM | POA: Diagnosis not present

## 2019-10-27 DIAGNOSIS — M859 Disorder of bone density and structure, unspecified: Secondary | ICD-10-CM | POA: Diagnosis not present

## 2019-10-27 DIAGNOSIS — E2839 Other primary ovarian failure: Secondary | ICD-10-CM | POA: Diagnosis not present

## 2019-10-28 ENCOUNTER — Telehealth (INDEPENDENT_AMBULATORY_CARE_PROVIDER_SITE_OTHER): Payer: Self-pay | Admitting: Internal Medicine

## 2019-10-28 DIAGNOSIS — M25519 Pain in unspecified shoulder: Secondary | ICD-10-CM | POA: Diagnosis not present

## 2019-10-28 DIAGNOSIS — R1012 Left upper quadrant pain: Secondary | ICD-10-CM | POA: Diagnosis not present

## 2019-10-28 DIAGNOSIS — Z299 Encounter for prophylactic measures, unspecified: Secondary | ICD-10-CM | POA: Diagnosis not present

## 2019-10-28 DIAGNOSIS — Z789 Other specified health status: Secondary | ICD-10-CM | POA: Diagnosis not present

## 2019-10-28 DIAGNOSIS — Z6828 Body mass index (BMI) 28.0-28.9, adult: Secondary | ICD-10-CM | POA: Diagnosis not present

## 2019-10-28 NOTE — Telephone Encounter (Signed)
Patient called stated she had a CT scan done on 2/17 and since then the top of her stomach has been hurting and burning because she had to stretch to put her hands above her head - please advise - [phone]

## 2019-10-28 NOTE — Telephone Encounter (Signed)
Please advise the patient that Dr.Rehman recommends that she follow up with her PCP regarding this matter.

## 2019-11-04 DIAGNOSIS — M8580 Other specified disorders of bone density and structure, unspecified site: Secondary | ICD-10-CM | POA: Diagnosis not present

## 2019-11-04 DIAGNOSIS — Z299 Encounter for prophylactic measures, unspecified: Secondary | ICD-10-CM | POA: Diagnosis not present

## 2019-11-05 DIAGNOSIS — Z23 Encounter for immunization: Secondary | ICD-10-CM | POA: Diagnosis not present

## 2019-11-06 DIAGNOSIS — M25511 Pain in right shoulder: Secondary | ICD-10-CM | POA: Diagnosis not present

## 2019-11-17 DIAGNOSIS — R0789 Other chest pain: Secondary | ICD-10-CM | POA: Diagnosis not present

## 2019-11-17 DIAGNOSIS — Z299 Encounter for prophylactic measures, unspecified: Secondary | ICD-10-CM | POA: Diagnosis not present

## 2019-11-24 DIAGNOSIS — I700 Atherosclerosis of aorta: Secondary | ICD-10-CM | POA: Diagnosis not present

## 2019-11-24 DIAGNOSIS — Z299 Encounter for prophylactic measures, unspecified: Secondary | ICD-10-CM | POA: Diagnosis not present

## 2019-11-24 DIAGNOSIS — R0789 Other chest pain: Secondary | ICD-10-CM | POA: Diagnosis not present

## 2019-11-30 DIAGNOSIS — K760 Fatty (change of) liver, not elsewhere classified: Secondary | ICD-10-CM | POA: Diagnosis not present

## 2019-11-30 DIAGNOSIS — Z853 Personal history of malignant neoplasm of breast: Secondary | ICD-10-CM | POA: Diagnosis not present

## 2019-11-30 DIAGNOSIS — J9811 Atelectasis: Secondary | ICD-10-CM | POA: Diagnosis not present

## 2019-11-30 DIAGNOSIS — I700 Atherosclerosis of aorta: Secondary | ICD-10-CM | POA: Diagnosis not present

## 2019-11-30 DIAGNOSIS — K449 Diaphragmatic hernia without obstruction or gangrene: Secondary | ICD-10-CM | POA: Diagnosis not present

## 2019-11-30 DIAGNOSIS — Z9049 Acquired absence of other specified parts of digestive tract: Secondary | ICD-10-CM | POA: Diagnosis not present

## 2019-12-03 ENCOUNTER — Other Ambulatory Visit (INDEPENDENT_AMBULATORY_CARE_PROVIDER_SITE_OTHER): Payer: Self-pay | Admitting: Gastroenterology

## 2019-12-03 DIAGNOSIS — K219 Gastro-esophageal reflux disease without esophagitis: Secondary | ICD-10-CM

## 2019-12-07 DIAGNOSIS — M659 Synovitis and tenosynovitis, unspecified: Secondary | ICD-10-CM | POA: Diagnosis not present

## 2019-12-07 DIAGNOSIS — M19011 Primary osteoarthritis, right shoulder: Secondary | ICD-10-CM | POA: Diagnosis not present

## 2019-12-07 DIAGNOSIS — M7591 Shoulder lesion, unspecified, right shoulder: Secondary | ICD-10-CM | POA: Diagnosis not present

## 2019-12-07 DIAGNOSIS — M25511 Pain in right shoulder: Secondary | ICD-10-CM | POA: Diagnosis not present

## 2019-12-07 DIAGNOSIS — M25411 Effusion, right shoulder: Secondary | ICD-10-CM | POA: Diagnosis not present

## 2019-12-07 DIAGNOSIS — M75101 Unspecified rotator cuff tear or rupture of right shoulder, not specified as traumatic: Secondary | ICD-10-CM | POA: Diagnosis not present

## 2019-12-11 DIAGNOSIS — M25511 Pain in right shoulder: Secondary | ICD-10-CM | POA: Diagnosis not present

## 2020-01-02 ENCOUNTER — Other Ambulatory Visit (INDEPENDENT_AMBULATORY_CARE_PROVIDER_SITE_OTHER): Payer: Self-pay | Admitting: Gastroenterology

## 2020-01-20 ENCOUNTER — Telehealth (INDEPENDENT_AMBULATORY_CARE_PROVIDER_SITE_OTHER): Payer: Self-pay | Admitting: Gastroenterology

## 2020-01-20 NOTE — Telephone Encounter (Signed)
Patient left voice mail message stating she saw you back in February was prescribed Sucralfate which is not working - states she is still feeling bloated and her stomach feels like it is burning all the time - please advise ph# [phone]

## 2020-01-20 NOTE — Telephone Encounter (Signed)
Would recommend a follow-up with Dr. Laural Golden or myself in office to discuss symptoms further. Thanks!

## 2020-01-21 DIAGNOSIS — R0789 Other chest pain: Secondary | ICD-10-CM | POA: Diagnosis not present

## 2020-01-21 DIAGNOSIS — R0781 Pleurodynia: Secondary | ICD-10-CM | POA: Diagnosis not present

## 2020-01-21 DIAGNOSIS — Z299 Encounter for prophylactic measures, unspecified: Secondary | ICD-10-CM | POA: Diagnosis not present

## 2020-01-21 DIAGNOSIS — I700 Atherosclerosis of aorta: Secondary | ICD-10-CM | POA: Diagnosis not present

## 2020-01-26 ENCOUNTER — Ambulatory Visit (INDEPENDENT_AMBULATORY_CARE_PROVIDER_SITE_OTHER): Payer: Medicare Other | Admitting: Gastroenterology

## 2020-01-26 ENCOUNTER — Other Ambulatory Visit: Payer: Self-pay

## 2020-01-26 ENCOUNTER — Encounter (INDEPENDENT_AMBULATORY_CARE_PROVIDER_SITE_OTHER): Payer: Self-pay | Admitting: Gastroenterology

## 2020-01-26 VITALS — BP 159/85 | HR 93 | Temp 97.4°F | Ht <= 58 in | Wt 141.7 lb

## 2020-01-26 DIAGNOSIS — R1013 Epigastric pain: Secondary | ICD-10-CM

## 2020-01-26 DIAGNOSIS — G8929 Other chronic pain: Secondary | ICD-10-CM | POA: Diagnosis not present

## 2020-01-26 MED ORDER — SUCRALFATE 1 G PO TABS
1.0000 g | ORAL_TABLET | Freq: Three times a day (TID) | ORAL | 3 refills | Status: DC
Start: 2020-01-26 — End: 2020-04-27

## 2020-01-26 MED ORDER — AMITRIPTYLINE HCL 25 MG PO TABS: 25.0000 mg | ORAL_TABLET | Freq: Every day | ORAL | 3 refills | Status: DC

## 2020-01-26 NOTE — Progress Notes (Signed)
Patient profile: Melanie David is a 74 y.o. female seen for f/up of abd pain . Last seen in clinic Jan 2021.   History of Present Illness: Melanie David is seen today for continued issues with epigastric burning.  She was seen in December 2020, symptoms continued issues and had a CT in February 2021 that was unremarkable.  She reports Carafate twice daily did improve her epigastric burning some but did not completely resolve it.  Eating as well as laying on her back makes it worse.  She is laying on her back more because she has had issues with her right rotator cuff as well as left-sided lymphadenopathy recently. She does feel Mylanta helps some as well.  She did not find Bentyl or Protonix helped the pain.  She has also noted a bulge on her left side upper abdomen with a burning pain radiating under rib cage around from back to epigastric area. No nausea/vomiting   She denies any lower GI symptoms.  She is having regular bowel movements without any abdominal pain, constipation, diarrhea, melena.  Wt Readings from Last 3 Encounters:  01/26/20 141 lb 11.2 oz (64.3 kg)  09/16/19 139 lb 4.8 oz (63.2 kg)  02/05/19 135 lb 14.4 oz (61.6 kg)     Last Colonoscopy: 08/2015 Prep excellent. 4 mm polyp cold snared from proximal transverse colon. Altered will diverticula noted at sigmoid colon some of which were large. Normal rectal mucosa. Small hemorrhoids below the dentate line Path - tubular adenoma, 5 year repeat recommended.     Last Endoscopy: EGD 12/2018-small 3cm sliding hiatal hernia,healed ulcer in gastric antrum, and nodular bulbar mucosa and biopsy only revealed peptic duodenitis   Past Medical History:  Past Medical History:  Diagnosis Date  . Cancer ([place])    Left breast cancer: Lumpectomy  . Osteoporosis     Problem List: Patient Active Problem List   Diagnosis Date Noted  . Early satiety 02/05/2019  . Loss of weight 02/05/2019  . AP (abdominal pain) 02/05/2019  .  Abdominal pain, epigastric 01/07/2019  . H. pylori infection 01/07/2019  . Dizziness 01/14/2017  . Neck pain 01/14/2017  . Neuropathy 01/14/2017  . Ventral hernia 03/13/2011    Past Surgical History: Past Surgical History:  Procedure Laterality Date  . ABDOMINAL HYSTERECTOMY    . APPENDECTOMY    . BIOPSY  01/14/2019   Procedure: BIOPSY;  Surgeon: Rogene Houston, MD;  Location: [facility];  Service: Endoscopy;;  . BREAST SURGERY    . CATARACT EXTRACTION W/PHACO  09/22/2012   Procedure: CATARACT EXTRACTION PHACO AND INTRAOCULAR LENS PLACEMENT (IOC);  Surgeon: Tonny Branch, MD;  Location: [facility] ORS;  Service: Ophthalmology;  Laterality: Right;  CDE=13.95  . CATARACT EXTRACTION W/PHACO Left 10/06/2012   Procedure: CATARACT EXTRACTION PHACO AND INTRAOCULAR LENS PLACEMENT (IOC);  Surgeon: Tonny Branch, MD;  Location: [facility];  Service: Ophthalmology;  Laterality: Left;  CDE: 14.58  . [place] SURGERY  2010  . CHOLECYSTECTOMY    . COLONOSCOPY N/A 09/01/2015   Procedure: COLONOSCOPY;  Surgeon: Rogene Houston, MD;  Location: [facility];  Service: Endoscopy;  Laterality: N/A;  9:30 - moved to 11:00 - Ann to notify pt  . ESOPHAGOGASTRODUODENOSCOPY N/A 01/14/2019   Procedure: ESOPHAGOGASTRODUODENOSCOPY (EGD);  Surgeon: Rogene Houston, MD;  Location: [facility];  Service: Endoscopy;  Laterality: N/A;  . HERNIA REPAIR      Allergies: No Known Allergies    Home Medications:  Current Outpatient  Medications:  .  acyclovir (ZOVIRAX) 400 MG tablet, Take 400 mg by mouth 2 (two) times daily. , Disp: , Rfl:  .  Calcium Carb-Cholecalciferol (CALCIUM 600-D PO), Take by mouth daily., Disp: , Rfl:  .  dicyclomine (BENTYL) 10 MG capsule, TAKE 1 CAPSULE(10 MG) BY MOUTH TWICE DAILY BEFORE A MEAL( BREAKFAST AND SUPPER), Disp: 60 capsule, Rfl: 5 .  pantoprazole (PROTONIX) 40 MG tablet, TAKE 1 TABLET(40 MG) BY MOUTH DAILY, Disp: 30 tablet, Rfl: 5 .  amitriptyline (ELAVIL) 25 MG tablet, Take 1 tablet (25  mg total) by mouth at bedtime., Disp: 30 tablet, Rfl: 3 .  sucralfate (CARAFATE) 1 g tablet, Take 1 tablet (1 g total) by mouth 4 (four) times daily -  before meals and at bedtime., Disp: 120 tablet, Rfl: 3   Family History: family history includes Heart disease in her father.    Social History:   reports that she has never smoked. She has never used smokeless tobacco. She reports that she does not drink alcohol or use drugs.   Review of Systems: Constitutional: Denies weight loss/weight gain  Eyes: No changes in vision. ENT: No oral lesions, sore throat.  GI: see HPI.  Heme/Lymph: No easy bruising.  CV: No chest pain.  GU: No hematuria.  Integumentary: No rashes.  Neuro: No headaches.  Psych: No depression/anxiety.  Endocrine: No heat/cold intolerance.  Allergic/Immunologic: No urticaria.  Resp: No cough, SOB.  Musculoskeletal: No joint swelling.    Physical Examination: BP (!) 159/85 (BP Location: Right Arm, Patient Position: Sitting, Cuff Size: Large)   Pulse 93   Temp (!) 97.4 F (36.3 C) (Temporal)   Ht 4\' 9"  (1.448 m)   Wt 141 lb 11.2 oz (64.3 kg)   BMI 30.66 kg/m  Gen: NAD, alert and oriented x 4 HEENT: PEERLA, EOMI, Neck: supple, no JVD Chest: CTA bilaterally, no wheezes, crackles, or other adventitious sounds CV: RRR, no m/g/c/r Abd: soft, NT, ND, +BS in all four quadrants; no HSM, guarding, ridigity, or rebound tenderness Ext: no edema, well perfused with 2+ pulses, Skin: no rash or lesions noted on observed skin Lymph: no noted LAD  Data Reviewed:  UGI series 01/2019-IMPRESSION: Unremarkable upper GI exam.  Abd [place] 11/2018-IMPRESSION: Postop cholecystectomy without biliary dilatation. Liver shows mild increased echogenicity suggesting fatty infiltration.  CT 10/2018 a/p - unremarkable   03/23/2019-H pylori stool antigen not detected   01/2019--cbc w/ wbc 3.5, lymphs 721, otw normal, ace normal, sed rate normal  11/2018-H pylori IgG Abs positive    Jan 2021 labs---negative h pylori stool antigen, cbc, cmp, lipase normal   Assessment/Plan: Melanie David is a 74 y.o. female   1.  Epigastric burning-work-up including CT March 2020 and February 2021 unremarkable.  Labs January 2021 unremarkable.  She had an endoscopy in May 2020.  She had some improvement with Carafate twice daily, will have her increase to 4 times a day to see if this improves.  I suspect she has a nerve component of her pain and will try amitriptyline as well.  Reviewed to take at bedtime.  She is to contact me if she is not improving.  2.  History of colon polyps-due for repeat colonoscopy 2022   Melanie David was seen today for follow-up.  Diagnoses and all orders for this visit:  Abdominal pain, chronic, epigastric  Other orders -     sucralfate (CARAFATE) 1 g tablet; Take 1 tablet (1 g total) by mouth 4 (four) times daily -  before meals  and at bedtime. -     amitriptyline (ELAVIL) 25 MG tablet; Take 1 tablet (25 mg total) by mouth at bedtime.    F/up 2 months to assess response to meds, sooner if needed  I personally performed the service, non-incident to. (WP)  Laurine Blazer, [facility]

## 2020-01-26 NOTE — Patient Instructions (Signed)
-  start amitriptyline at bedtime  -increase Carafate to 3-4x/day if helps reduce burning sensation  -follow up 2 months, call sooner if not improving

## 2020-03-18 DIAGNOSIS — Z299 Encounter for prophylactic measures, unspecified: Secondary | ICD-10-CM | POA: Diagnosis not present

## 2020-03-18 DIAGNOSIS — N644 Mastodynia: Secondary | ICD-10-CM | POA: Diagnosis not present

## 2020-03-18 DIAGNOSIS — E7800 Pure hypercholesterolemia, unspecified: Secondary | ICD-10-CM | POA: Diagnosis not present

## 2020-03-18 DIAGNOSIS — G629 Polyneuropathy, unspecified: Secondary | ICD-10-CM | POA: Diagnosis not present

## 2020-03-18 DIAGNOSIS — I700 Atherosclerosis of aorta: Secondary | ICD-10-CM | POA: Diagnosis not present

## 2020-04-19 DIAGNOSIS — R918 Other nonspecific abnormal finding of lung field: Secondary | ICD-10-CM | POA: Diagnosis not present

## 2020-04-19 DIAGNOSIS — I700 Atherosclerosis of aorta: Secondary | ICD-10-CM | POA: Diagnosis not present

## 2020-04-19 DIAGNOSIS — Z299 Encounter for prophylactic measures, unspecified: Secondary | ICD-10-CM | POA: Diagnosis not present

## 2020-04-19 DIAGNOSIS — N644 Mastodynia: Secondary | ICD-10-CM | POA: Diagnosis not present

## 2020-04-19 DIAGNOSIS — M898X8 Other specified disorders of bone, other site: Secondary | ICD-10-CM | POA: Diagnosis not present

## 2020-04-27 ENCOUNTER — Encounter (INDEPENDENT_AMBULATORY_CARE_PROVIDER_SITE_OTHER): Payer: Self-pay | Admitting: Gastroenterology

## 2020-04-27 ENCOUNTER — Other Ambulatory Visit: Payer: Self-pay

## 2020-04-27 ENCOUNTER — Ambulatory Visit (INDEPENDENT_AMBULATORY_CARE_PROVIDER_SITE_OTHER): Payer: Medicare Other | Admitting: Gastroenterology

## 2020-04-27 VITALS — BP 113/73 | HR 92 | Temp 98.0°F | Ht <= 58 in | Wt 143.1 lb

## 2020-04-27 DIAGNOSIS — R1012 Left upper quadrant pain: Secondary | ICD-10-CM | POA: Diagnosis not present

## 2020-04-27 DIAGNOSIS — Z860100 Personal history of colon polyps, unspecified: Secondary | ICD-10-CM

## 2020-04-27 MED ORDER — AMITRIPTYLINE HCL 25 MG PO TABS: 25.0000 mg | ORAL_TABLET | Freq: Every day | ORAL | 3 refills | Status: DC

## 2020-04-27 MED ORDER — SUCRALFATE 1 G PO TABS: 1.0000 g | ORAL_TABLET | Freq: Three times a day (TID) | ORAL | 3 refills | Status: DC

## 2020-04-27 NOTE — Progress Notes (Addendum)
Patient profile: Melanie David is a 74 y.o. female seen for evaluation of epigastric pain. Last seen in clinic on 01/2020  History of Present Illness: Melanie David is seen today for follow-up.  At her last visit she had left upper quadrant burning radiating to back and epigastric area.  At that visit she was started amitriptyline 25 mg nightly and Carafate increased from twice a day to 4 times a day.  She is seen today for follow-up.  She reports medication changes at her last visit significantly helping her symptoms.  She notices worsening symptoms if she forgets some of her Carafate dosing.  Feels that 4 times a day helps a lot more than twice a day.  She denies any gerd, n/v.   She has been evaluated daily denies any constipation, diarrhea, melena, rectal bleeding.  No lower abdominal pain.  Continues to have the epigastric left upper quadrant and left back pain but these are improved with amitriptyline 25 mg.  She has noticed some very mild morning fatigue since amitriptyline but feels it is helping the pain significantly.  Wt Readings from Last 3 Encounters:  04/27/20 143 lb 1.6 oz (64.9 kg)  01/26/20 141 lb 11.2 oz (64.3 kg)  09/16/19 139 lb 4.8 oz (63.2 kg)     Last Colonoscopy:08/2015 Prep excellent. 4 mm polyp cold snared from proximal transverse colon. Altered will diverticula noted at sigmoid colon some of which were large. Normal rectal mucosa. Small hemorrhoids below the dentate line Path - tubular adenoma, 5 year repeat recommended.    Last Endoscopy:EGD 12/2018-small3cmsliding hiatal hernia,healed ulcer in gastric antrum,and nodular bulbar mucosa and biopsy only revealed peptic duodenitis   Past Medical History:  Past Medical History:  Diagnosis Date   Cancer ([place])    Left breast cancer: Lumpectomy   Osteoporosis     Problem List: Patient Active Problem List   Diagnosis Date Noted   Early satiety 02/05/2019   Loss of weight 02/05/2019   AP  (abdominal pain) 02/05/2019   Abdominal pain, epigastric 01/07/2019   H. pylori infection 01/07/2019   Dizziness 01/14/2017   Neck pain 01/14/2017   Neuropathy 01/14/2017   Ventral hernia 03/13/2011    Past Surgical History: Past Surgical History:  Procedure Laterality Date   ABDOMINAL HYSTERECTOMY     APPENDECTOMY     BIOPSY  01/14/2019   Procedure: BIOPSY;  Surgeon: Rogene Houston, MD;  [facility];  Service: Endoscopy;;   BREAST SURGERY     CATARACT EXTRACTION W/PHACO  09/22/2012   Procedure: CATARACT EXTRACTION PHACO AND INTRAOCULAR LENS PLACEMENT ([place]);  Surgeon: Tonny Branch, MD;  Location: [facility] ORS;  Service: Ophthalmology;  Laterality: Right;  CDE=13.95   CATARACT EXTRACTION W/PHACO Left 10/06/2012   Procedure: CATARACT EXTRACTION PHACO AND INTRAOCULAR LENS PLACEMENT (IOC);  Surgeon: Tonny Branch, MD;  Location: [facility] ORS;  Service: Ophthalmology;  Laterality: Left;  CDE: 14.58   CERVICAL DISC SURGERY  2010   CHOLECYSTECTOMY     COLONOSCOPY N/A 09/01/2015   Procedure: COLONOSCOPY;  Surgeon: Rogene Houston, MD;  Location[facility];  Service: Endoscopy;  Laterality: N/A;  9:30 - moved to 11:00 - Ann to notify pt   ESOPHAGOGASTRODUODENOSCOPY N/A 01/14/2019   Procedure: ESOPHAGOGASTRODUODENOSCOPY (EGD);  Surgeon: Rogene Houston, MD;  Location[facility];  Service: Endoscopy;  Laterality: N/A;   HERNIA REPAIR      Allergies: No Known Allergies    Home Medications:  Current Outpatient Medications:  amitriptyline (ELAVIL) 25 MG tablet, Take 1 tablet (25 mg total) by mouth at bedtime., Disp: 30 tablet, Rfl: 3   Calcium Carb-Cholecalciferol (CALCIUM 600-D PO), Take by mouth daily., Disp: , Rfl:    sucralfate (CARAFATE) 1 g tablet, Take 1 tablet (1 g total) by mouth 4 (four) times daily -  before meals and at bedtime., Disp: 120 tablet, Rfl: 3   acyclovir (ZOVIRAX) 400 MG tablet, Take 400 mg by mouth 2 (two) times daily.  (Patient not taking:  Reported on 04/27/2020), Disp: , Rfl:    pantoprazole (PROTONIX) 40 MG tablet, TAKE 1 TABLET(40 MG) BY MOUTH DAILY (Patient not taking: Reported on 04/27/2020), Disp: 30 tablet, Rfl: 5   Family History: family history includes Heart disease in her father.    Social History:   reports that she has never smoked. She has never used smokeless tobacco. She reports that she does not drink alcohol and does not use drugs.   Review of Systems: Constitutional: Denies weight loss/weight gain  Eyes: No changes in vision. ENT: No oral lesions, sore throat.  GI: see HPI.  Heme/Lymph: No easy bruising.  CV: No chest pain.  GU: No hematuria.  Integumentary: No rashes.  Neuro: No headaches.  Psych: No depression/anxiety.  Endocrine: No heat/cold intolerance.  Allergic/Immunologic: No urticaria.  Resp: No cough, SOB.  Musculoskeletal: No joint swelling.    Physical Examination: BP 113/73 (BP Location: Right Arm, Patient Position: Sitting, Cuff Size: Normal)    Pulse 92    Temp 98 F (36.7 C) (Oral)    Ht 4\' 9"  (1.448 m)    Wt 143 lb 1.6 oz (64.9 kg)    BMI 30.97 kg/m  Gen: NAD, alert and oriented x 4 HEENT: PEERLA, EOMI, Neck: supple, no JVD Chest: CTA bilaterally, no wheezes, crackles, or other adventitious sounds CV: RRR, no m/g/c/r Abd: soft, NT, ND, +BS in all four quadrants; no HSM, guarding, ridigity, or rebound tenderness Ext: no edema, well perfused with 2+ pulses, Skin: no rash or lesions noted on observed skin Lymph: no noted LAD  Data Reviewed:  UGI series 01/2019-IMPRESSION: Unremarkable upper GI exam.  Abd [place] 11/2018-IMPRESSION: Postop cholecystectomy without biliary dilatation. Liver shows mild increased echogenicity suggesting fatty infiltration.  CT 10/2018 a/p - unremarkable   03/23/2019-H pylori stool antigen not detected   01/2019--cbc w/ wbc 3.5, lymphs 721, otw normal, ace normal, sed rate normal  11/2018-H pylori IgG Abs positive    Assessment/Plan: [name]  is a 74 y.o. female   1.  Epigastric/left upper quadrant pain-has had a CT and endoscopy for evaluation.  This may be neuropathic pain.  She has found significant improvement with Carafate 4 times a day and amitriptyline.  She did not nonresponse to Protonix and stopped.  We discussed possibly increasing amitriptyline to 50 mg nightly given partial improvement in symptoms but she would like to continue 25mg  qHS. She does have some David morning fatigue & will try taking this earlier in the evening.  2.  History of colon polyps-due for follow-up colonoscopy next January will follow up in office at that time to schedule.  No lower GI symptoms.   Shekia was seen today for follow-up.  Diagnoses and all orders for this visit:  Personal history of colonic polyps  LUQ pain     38-month follow-up.  To call with any worsening issues in the interim.   I personally performed the service, non-incident to. (WP)  Laurine Blazer, [facility] for Gastrointestinal Disease

## 2020-04-27 NOTE — Patient Instructions (Signed)
Try amitriptyline 2-3 hours before bed time, we can increase dose if needed. Continue carafate 4x/day.   Please notify me w/ any worsening symptoms, change in bowels, etc.

## 2020-05-11 DIAGNOSIS — R921 Mammographic calcification found on diagnostic imaging of breast: Secondary | ICD-10-CM | POA: Diagnosis not present

## 2020-05-11 DIAGNOSIS — R928 Other abnormal and inconclusive findings on diagnostic imaging of breast: Secondary | ICD-10-CM | POA: Diagnosis not present

## 2020-05-11 DIAGNOSIS — N644 Mastodynia: Secondary | ICD-10-CM | POA: Diagnosis not present

## 2020-05-11 DIAGNOSIS — N641 Fat necrosis of breast: Secondary | ICD-10-CM | POA: Diagnosis not present

## 2020-05-11 DIAGNOSIS — N6489 Other specified disorders of breast: Secondary | ICD-10-CM | POA: Diagnosis not present

## 2020-05-11 DIAGNOSIS — Z9889 Other specified postprocedural states: Secondary | ICD-10-CM | POA: Diagnosis not present

## 2020-05-24 ENCOUNTER — Other Ambulatory Visit (INDEPENDENT_AMBULATORY_CARE_PROVIDER_SITE_OTHER): Payer: Self-pay | Admitting: Gastroenterology

## 2020-05-31 ENCOUNTER — Other Ambulatory Visit (INDEPENDENT_AMBULATORY_CARE_PROVIDER_SITE_OTHER): Payer: Self-pay | Admitting: Gastroenterology

## 2020-06-20 ENCOUNTER — Other Ambulatory Visit (INDEPENDENT_AMBULATORY_CARE_PROVIDER_SITE_OTHER): Payer: Self-pay | Admitting: Gastroenterology

## 2020-06-20 NOTE — Telephone Encounter (Signed)
Last seen 04/27/2020 for polyps Melanie David.

## 2020-06-24 DIAGNOSIS — K219 Gastro-esophageal reflux disease without esophagitis: Secondary | ICD-10-CM | POA: Diagnosis not present

## 2020-06-24 DIAGNOSIS — E7849 Other hyperlipidemia: Secondary | ICD-10-CM | POA: Diagnosis not present

## 2020-06-24 DIAGNOSIS — M810 Age-related osteoporosis without current pathological fracture: Secondary | ICD-10-CM | POA: Diagnosis not present

## 2020-06-29 ENCOUNTER — Ambulatory Visit (INDEPENDENT_AMBULATORY_CARE_PROVIDER_SITE_OTHER): Payer: Medicare Other | Admitting: Gastroenterology

## 2020-06-29 ENCOUNTER — Other Ambulatory Visit: Payer: Self-pay

## 2020-06-29 ENCOUNTER — Encounter (INDEPENDENT_AMBULATORY_CARE_PROVIDER_SITE_OTHER): Payer: Self-pay | Admitting: Gastroenterology

## 2020-06-29 VITALS — BP 122/82 | HR 101 | Temp 98.0°F | Ht <= 58 in | Wt 143.2 lb

## 2020-06-29 DIAGNOSIS — K30 Functional dyspepsia: Secondary | ICD-10-CM | POA: Diagnosis not present

## 2020-06-29 DIAGNOSIS — R1013 Epigastric pain: Secondary | ICD-10-CM

## 2020-06-29 MED ORDER — BUSPIRONE HCL 5 MG PO TABS: 5.0000 mg | ORAL_TABLET | Freq: Three times a day (TID) | ORAL | 3 refills | Status: AC

## 2020-06-29 NOTE — Patient Instructions (Addendum)
Start Buspirone 5 mg three times a day Can take FBGard 1 tablet every 8-12 hours as needed for control of pain if severe

## 2020-06-29 NOTE — Progress Notes (Signed)
[name], M.D. Gastroenterology & Hepatology [facility] [address] [place], [place] [address]  Primary Care Physician: Monico Blitz, [place] [address]  I will communicate my assessment and recommendations to the referring MD via EMR. "Note: Occasional unusual wording and randomly placed punctuation marks may result from the use of speech recognition technology to transcribe this document"  Problems: 1. Functional dyspepsia  History of Present Illness: Melanie David is a 74 y.o. female with past medical history of left breast cancer, osteoporosis and functional dyspepsia, who presents for follow up of abdominal pain.  The patient was last seen on 04/27/2020. At that time, the patient was advised to take Carafate 1 g every 6 hours.  She was continued on amitriptyline 25 mg every night as the patient was not interested in increasing the dose.  The patient reports he has had persistent episodes of epigastric burning pain.  She is states that she has had the symptoms for the last 2 years, states that they are constant throughout the day.  Denies having any symptoms when she is sleeping and does not wake up with the burning sensation but she believes that the pain develops through the morning and is causing her significant discomfort.  She has not felt any improvement while taking the Carafate.  The patient reports that the amitriptyline helps to improve her back pain but not the abdominal pain.  Occasionally takes Mylanta which helps with her symptoms.  She has also tried to take a bland diet that avoids worsening of her symptomatology, usually when she eats very heavy meals she will have severe pain. The patient denies having any nausea, vomiting, fever, chills, hematochezia, melena, hematemesis, abdominal distention, diarrhea, jaundice, pruritus or weight loss.  Last Colonoscopy:08/2015 Prep excellent. 4 mm polyp cold  snared from proximal transverse colon. Altered will diverticula noted at sigmoid colon some of which were large. Normal rectal mucosa. Small hemorrhoids below the dentate line Path - tubular adenoma, 5 year repeat recommended.  Last Endoscopy:EGD 12/2018-small3cmsliding hiatal hernia,healed ulcer in gastric antrum,and nodular bulbar mucosa and biopsy only revealed peptic duodenitis  Past Medical History: Past Medical History:  Diagnosis Date  . Cancer ([place])    Left breast cancer: Lumpectomy  . Osteoporosis     Past Surgical History: Past Surgical History:  Procedure Laterality Date  . ABDOMINAL HYSTERECTOMY    . APPENDECTOMY    . BIOPSY  01/14/2019   Procedure: BIOPSY;  Surgeon: Rogene Houston, MD;  [facility];  Service: Endoscopy;;  . BREAST SURGERY    . CATARACT EXTRACTION W/PHACO  09/22/2012   Procedure: CATARACT EXTRACTION PHACO AND INTRAOCULAR LENS PLACEMENT (IOC);  Surgeon: Tonny Branch, MD;  Location: [facility] ORS;  Service: Ophthalmology;  Laterality: Right;  CDE=13.95  . CATARACT EXTRACTION W/PHACO Left 10/06/2012   Procedure: CATARACT EXTRACTION PHACO AND INTRAOCULAR LENS PLACEMENT (IOC);  Surgeon: Tonny Branch, MD;  Location: [facility] ORS;  Service: Ophthalmology;  Laterality: Left;  CDE: 14.58  . [place] SURGERY  2010  . CHOLECYSTECTOMY    . COLONOSCOPY N/A 09/01/2015   Procedure: COLONOSCOPY;  Surgeon: Rogene Houston, MD;  [facility];  Service: Endoscopy;  Laterality: N/A;  9:30 - moved to 11:00 - Ann to notify pt  . ESOPHAGOGASTRODUODENOSCOPY N/A 01/14/2019   Procedure: ESOPHAGOGASTRODUODENOSCOPY (EGD);  Surgeon: Rogene Houston, MD;  [facility];  Service: Endoscopy;  Laterality: N/A;  . HERNIA REPAIR  Family History: Family History  Problem Relation Age of Onset  . Heart disease Father     Social History: Social History   Tobacco Use  Smoking Status Never Smoker  Smokeless Tobacco Never Used   Social History    Substance and Sexual Activity  Alcohol Use No   Social History   Substance and Sexual Activity  Drug Use No    Allergies: No Known Allergies  Medications: Current Outpatient Medications  Medication Sig Dispense Refill  . acyclovir (ZOVIRAX) 400 MG tablet Take 400 mg by mouth 2 (two) times daily.     Marland Kitchen amitriptyline (ELAVIL) 25 MG tablet TAKE 1 TABLET(25 MG) BY MOUTH AT BEDTIME 30 tablet 3  . sucralfate (CARAFATE) 1 g tablet TAKE 1 TABLET(1 GRAM) BY MOUTH FOUR TIMES DAILY BEFORE MEALS AND AT BEDTIME 120 tablet 1  . Calcium Carb-Cholecalciferol (CALCIUM 600-D PO) Take by mouth daily. (Patient not taking: Reported on 06/29/2020)    . pantoprazole (PROTONIX) 40 MG tablet TAKE 1 TABLET(40 MG) BY MOUTH DAILY (Patient not taking: Reported on 04/27/2020) 30 tablet 5   No current facility-administered medications for this visit.    Review of Systems: GENERAL: negative for malaise, night sweats HEENT: No changes in hearing or vision, no nose bleeds or other nasal problems. NECK: Negative for lumps, goiter, pain and significant neck swelling RESPIRATORY: Negative for cough, wheezing CARDIOVASCULAR: Negative for chest pain, leg swelling, palpitations, orthopnea GI: SEE HPI MUSCULOSKELETAL: Negative for joint pain or swelling, back pain, and muscle pain. SKIN: Negative for lesions, rash PSYCH: Negative for sleep disturbance, mood disorder and recent psychosocial stressors. HEMATOLOGY Negative for prolonged bleeding, bruising easily, and swollen nodes. ENDOCRINE: Negative for cold or heat intolerance, polyuria, polydipsia and goiter. NEURO: negative for tremor, gait imbalance, syncope and seizures. The remainder of the review of systems is noncontributory.   Physical Exam: BP 122/82 (BP Location: Right Arm, Patient Position: Sitting, Cuff Size: Normal)   Pulse (!) 101   Temp 98 F (36.7 C) (Oral)   Ht 4\' 9"  (1.448 m)   Wt 143 lb 3.2 oz (65 kg)   BMI 30.99 kg/m  GENERAL: The  patient is AO x3, in no acute distress. HEENT: Head is normocephalic and atraumatic. EOMI are intact. Mouth is well hydrated and without lesions. NECK: Supple. No masses LUNGS: Clear to auscultation. No presence of rhonchi/wheezing/rales. Adequate chest expansion HEART: RRR, normal s1 and s2. ABDOMEN: Tender upon palpation of the epigastric area r, no guarding, no peritoneal signs, and nondistended. BS +. No masses. EXTREMITIES: Without any cyanosis, clubbing, rash, lesions or edema. NEUROLOGIC: AOx3, no focal motor deficit. SKIN: no jaundice, no rashes  Imaging/Labs: as above  I personally reviewed and interpreted the available labs, imaging and endoscopic files.  Impression and Plan: Melanie David is a 74 y.o. female with past medical history of left breast cancer, osteoporosis and functional dyspepsia, who presents for follow up of abdominal pain.  The patient has presented persistent abdominal pain in epigastric area which has not improved with her medications.  Notably, she has not presented any red flag signs and is not present any pain when sleeping.  I explained to the patient that her symptoms are likely functional in nature and will correspond to functional dyspepsia.  Since she is already on a TCA, she may benefit from trying buspirone at low-dose to improve her symptoms. She may also benefit from [facility] as needed to improve her abdominal pain.  We will address the colonic polyps in  next appointment, will schedule her for colonoscopy in 2022.  - Start Buspirone 5 mg three times a day - Can take FBGard 1 tablet every 8-12 hours as needed for control of pain if severe  All questions were answered.      Harvel Quale, MD Gastroenterology and Hepatology [facility]

## 2020-07-06 DIAGNOSIS — Z23 Encounter for immunization: Secondary | ICD-10-CM | POA: Diagnosis not present

## 2020-07-07 DIAGNOSIS — Z23 Encounter for immunization: Secondary | ICD-10-CM | POA: Diagnosis not present

## 2020-08-04 DIAGNOSIS — R202 Paresthesia of skin: Secondary | ICD-10-CM | POA: Diagnosis not present

## 2020-08-04 DIAGNOSIS — Z7189 Other specified counseling: Secondary | ICD-10-CM | POA: Diagnosis not present

## 2020-08-04 DIAGNOSIS — Z1339 Encounter for screening examination for other mental health and behavioral disorders: Secondary | ICD-10-CM | POA: Diagnosis not present

## 2020-08-04 DIAGNOSIS — Z1331 Encounter for screening for depression: Secondary | ICD-10-CM | POA: Diagnosis not present

## 2020-08-04 DIAGNOSIS — K219 Gastro-esophageal reflux disease without esophagitis: Secondary | ICD-10-CM | POA: Diagnosis not present

## 2020-08-04 DIAGNOSIS — Z6829 Body mass index (BMI) 29.0-29.9, adult: Secondary | ICD-10-CM | POA: Diagnosis not present

## 2020-08-04 DIAGNOSIS — Z0000 Encounter for general adult medical examination without abnormal findings: Secondary | ICD-10-CM | POA: Diagnosis not present

## 2020-08-04 DIAGNOSIS — Z299 Encounter for prophylactic measures, unspecified: Secondary | ICD-10-CM | POA: Diagnosis not present

## 2020-08-05 DIAGNOSIS — R5383 Other fatigue: Secondary | ICD-10-CM | POA: Diagnosis not present

## 2020-08-05 DIAGNOSIS — E7800 Pure hypercholesterolemia, unspecified: Secondary | ICD-10-CM | POA: Diagnosis not present

## 2020-08-05 DIAGNOSIS — Z79899 Other long term (current) drug therapy: Secondary | ICD-10-CM | POA: Diagnosis not present

## 2020-08-05 DIAGNOSIS — E559 Vitamin D deficiency, unspecified: Secondary | ICD-10-CM | POA: Diagnosis not present

## 2020-08-08 IMAGING — US ULTRASOUND ABDOMEN COMPLETE
1 series · 14 of 25 positions shown · non-contrast
Comparison: CT abdomen pelvis 10/27/2018

CLINICAL DATA: Weight loss early satiety.  History of breast cancer

EXAM:
ABDOMEN ULTRASOUND COMPLETE

[Series 1: ultrasound abdomen complete · 14 of 80 slices shown]
[im 1/80]
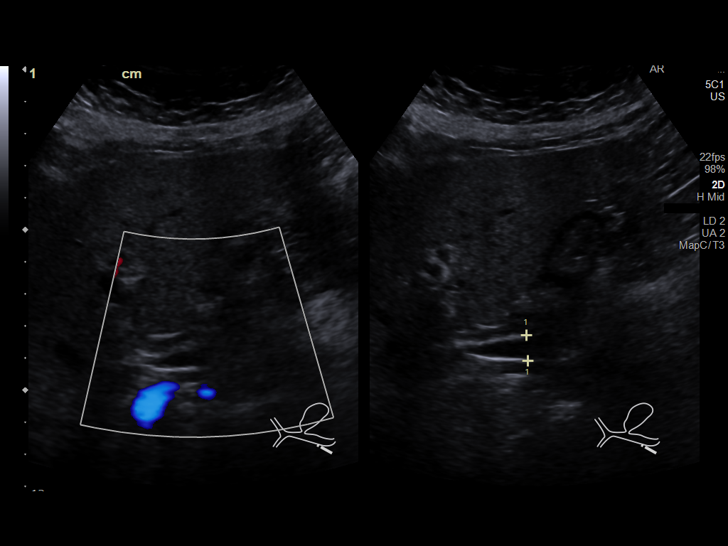
[im 7/80]
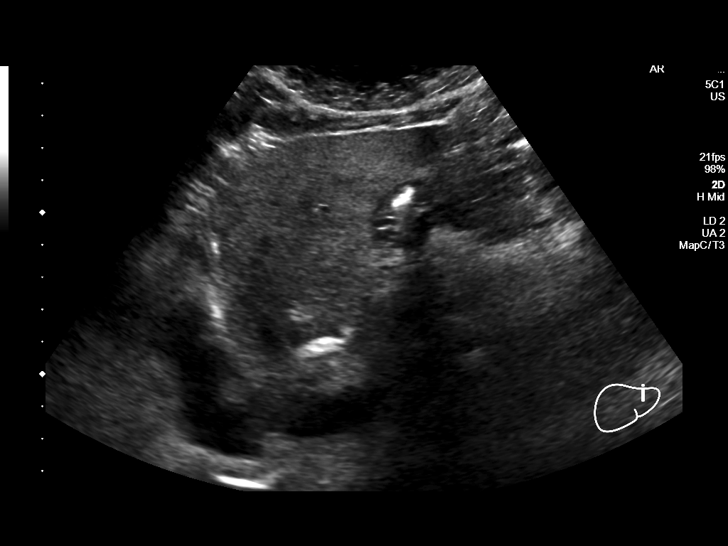
[im 14/80]
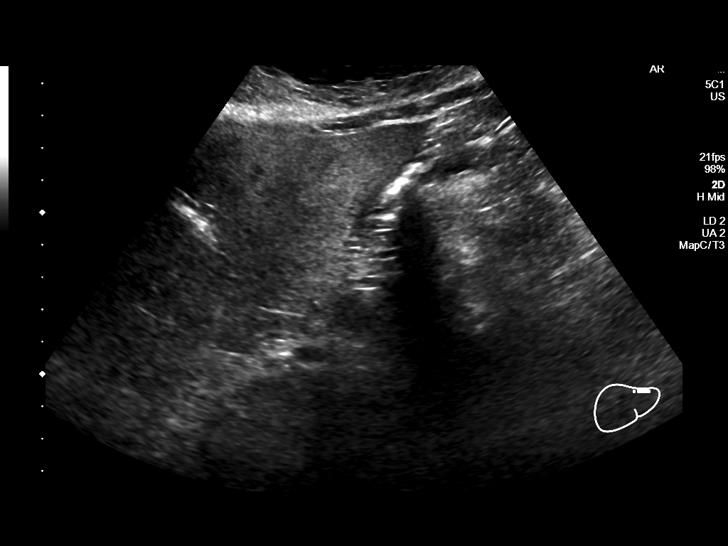
[im 20/80]
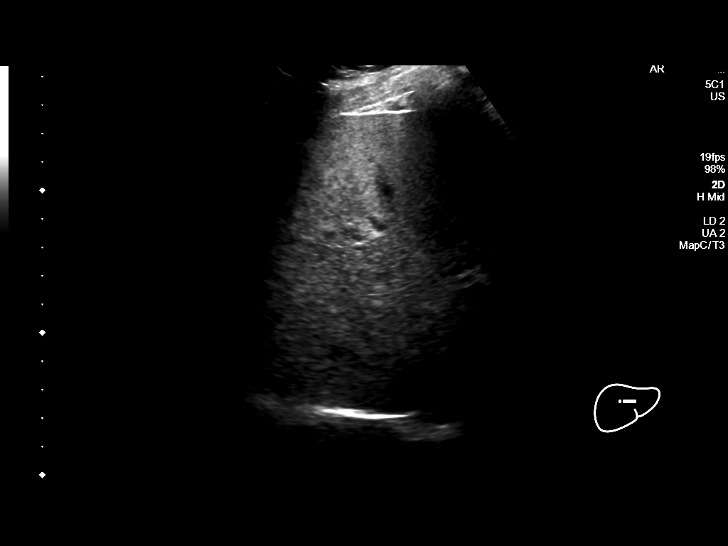
[im 27/80]
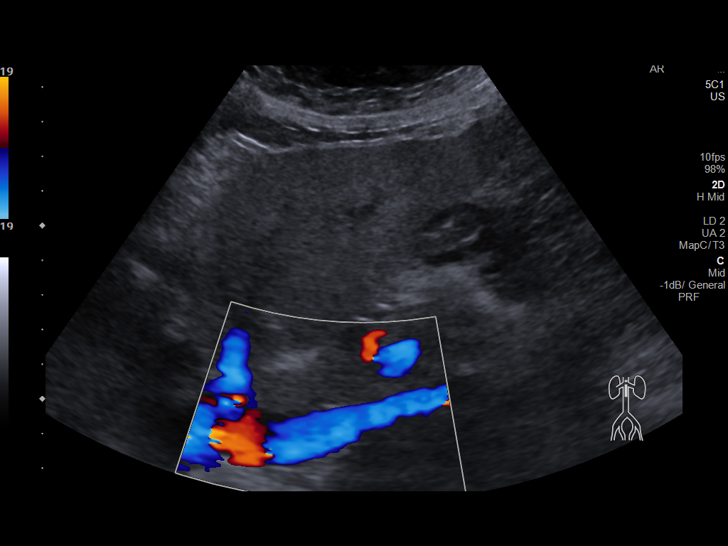
[im 30/80]
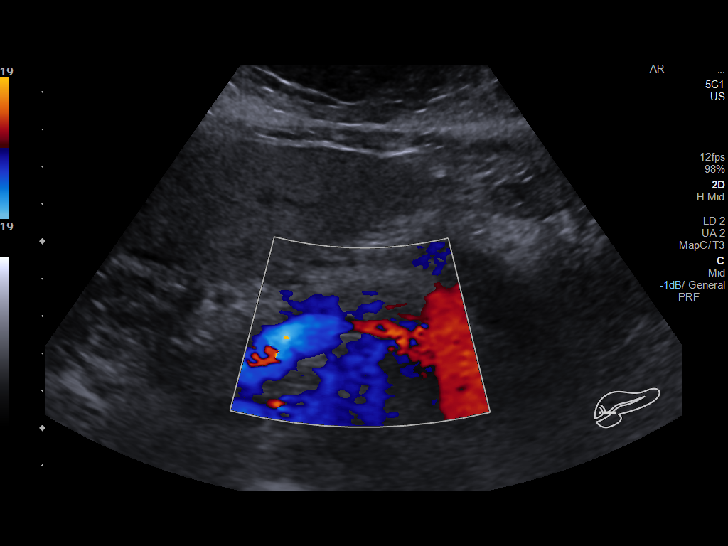
[im 37/80]
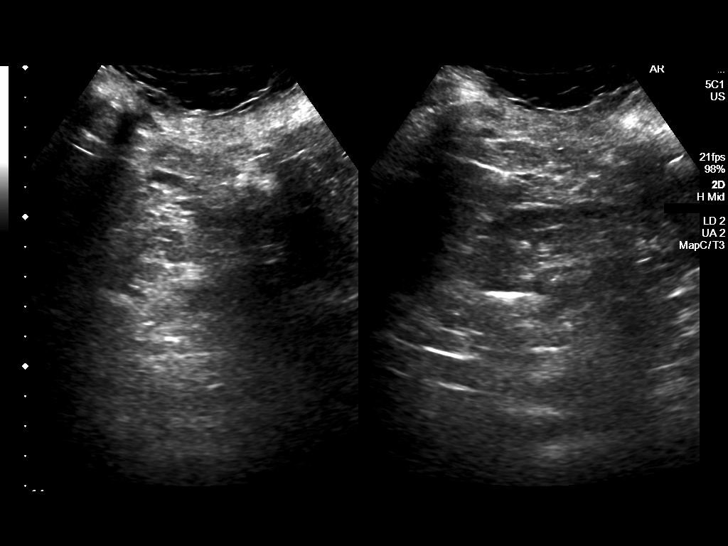
[im 43/80]
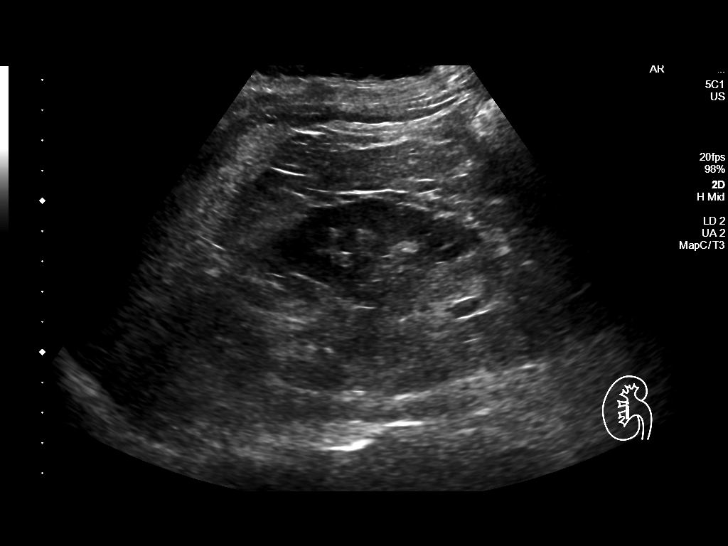
[im 50/80]
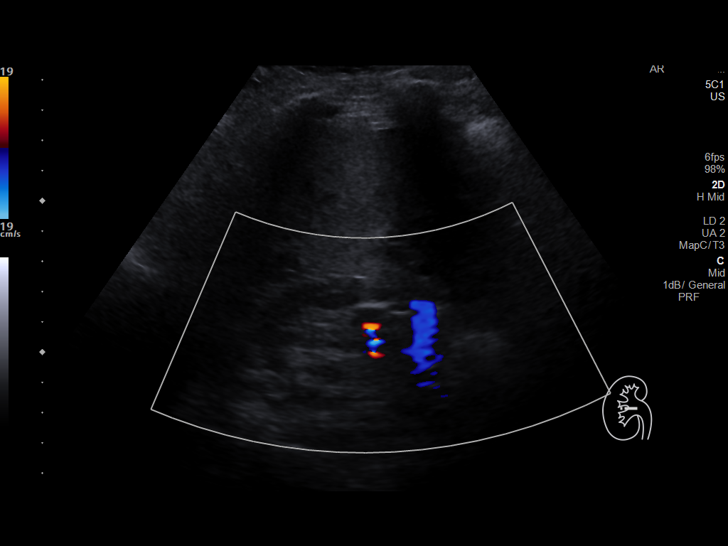
[im 53/80]
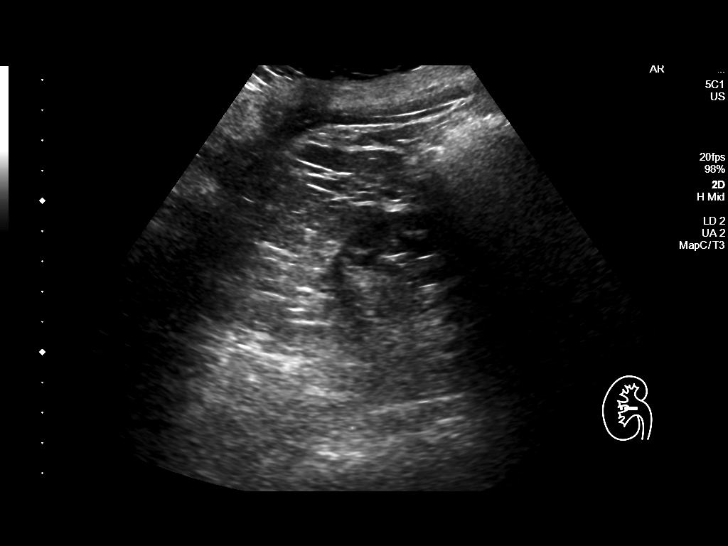
[im 60/80]
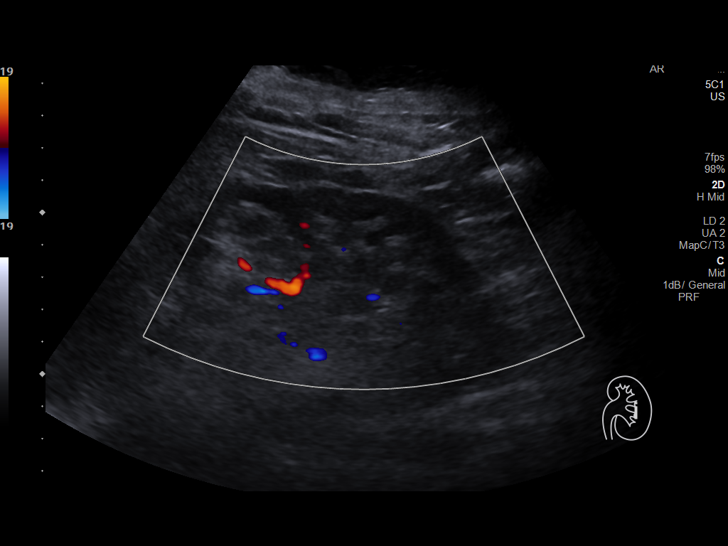
[im 66/80]
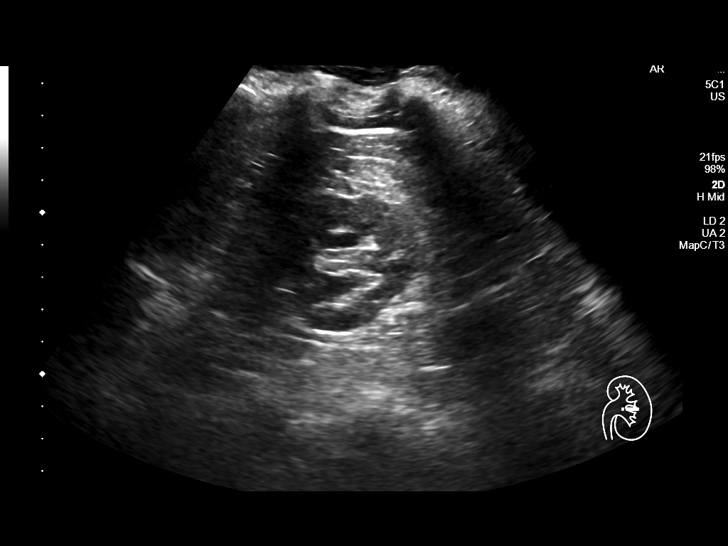
[im 73/80]
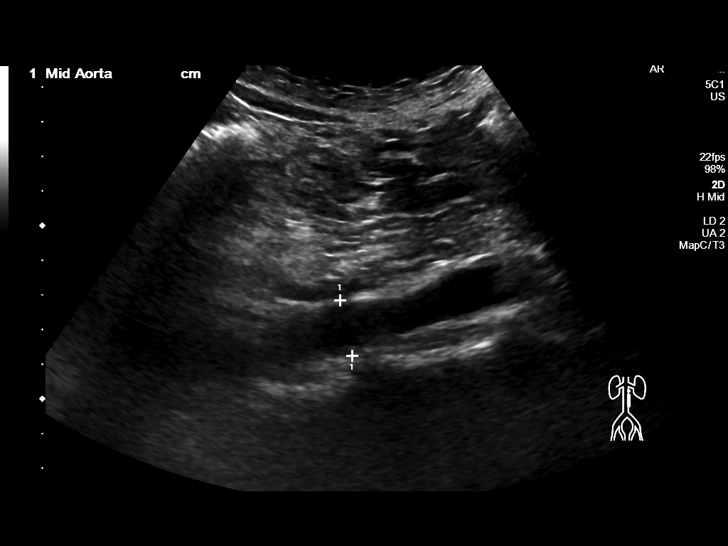
[im 80/80]
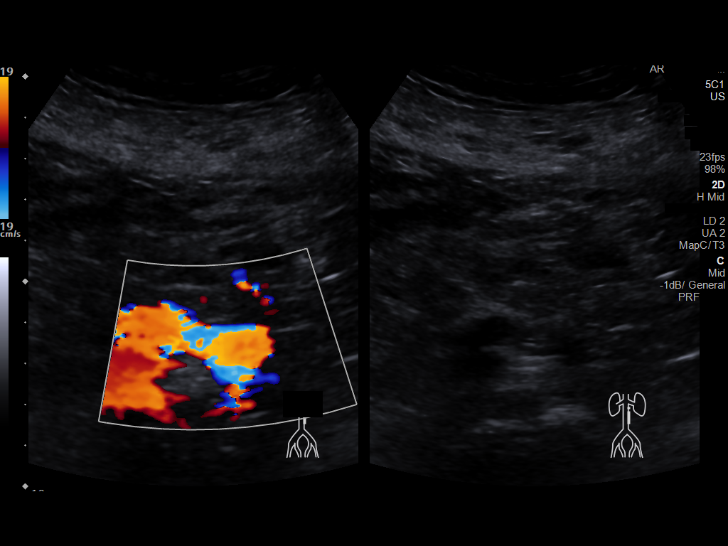

[14 of 25 positions shown; findings below may reference images not displayed]

FINDINGS: Gallbladder: Cholecystectomy

Common bile duct: Diameter: 8 mm, within normal limits for
cholecystectomy

Liver: Increased echogenicity liver without focal lesion. Portal
vein is patent on color Doppler imaging with normal direction of
blood flow towards the liver.

IVC: No abnormality visualized.

Pancreas: Visualized portion unremarkable.

Spleen: Size and appearance within normal limits.

Right Kidney: Length: 11.0 cm. Echogenicity within normal limits. No
mass or hydronephrosis visualized.

Left Kidney: Length: 10.2 cm. Echogenicity within normal limits. No
mass or hydronephrosis visualized.

Abdominal aorta: No aneurysm visualized.

Other findings: None.
IMPRESSION: Postop cholecystectomy without biliary dilatation. Liver shows mild
increased echogenicity suggesting fatty infiltration.

## 2020-08-26 DIAGNOSIS — E7849 Other hyperlipidemia: Secondary | ICD-10-CM | POA: Diagnosis not present

## 2020-08-26 DIAGNOSIS — K219 Gastro-esophageal reflux disease without esophagitis: Secondary | ICD-10-CM | POA: Diagnosis not present

## 2020-08-31 DIAGNOSIS — M19011 Primary osteoarthritis, right shoulder: Secondary | ICD-10-CM | POA: Diagnosis not present

## 2020-08-31 DIAGNOSIS — M24111 Other articular cartilage disorders, right shoulder: Secondary | ICD-10-CM | POA: Diagnosis not present

## 2020-08-31 DIAGNOSIS — G8918 Other acute postprocedural pain: Secondary | ICD-10-CM | POA: Diagnosis not present

## 2020-08-31 DIAGNOSIS — M7521 Bicipital tendinitis, right shoulder: Secondary | ICD-10-CM | POA: Diagnosis not present

## 2020-08-31 DIAGNOSIS — M75121 Complete rotator cuff tear or rupture of right shoulder, not specified as traumatic: Secondary | ICD-10-CM | POA: Diagnosis not present

## 2020-08-31 DIAGNOSIS — M7541 Impingement syndrome of right shoulder: Secondary | ICD-10-CM | POA: Diagnosis not present

## 2020-09-05 ENCOUNTER — Other Ambulatory Visit: Payer: Self-pay

## 2020-09-05 ENCOUNTER — Telehealth (INDEPENDENT_AMBULATORY_CARE_PROVIDER_SITE_OTHER): Payer: Medicare Other | Admitting: Gastroenterology

## 2020-09-05 ENCOUNTER — Telehealth (INDEPENDENT_AMBULATORY_CARE_PROVIDER_SITE_OTHER): Payer: Self-pay

## 2020-09-05 ENCOUNTER — Encounter (INDEPENDENT_AMBULATORY_CARE_PROVIDER_SITE_OTHER): Payer: Self-pay

## 2020-09-05 ENCOUNTER — Other Ambulatory Visit (INDEPENDENT_AMBULATORY_CARE_PROVIDER_SITE_OTHER): Payer: Self-pay

## 2020-09-05 ENCOUNTER — Encounter (INDEPENDENT_AMBULATORY_CARE_PROVIDER_SITE_OTHER): Payer: Self-pay | Admitting: Gastroenterology

## 2020-09-05 VITALS — Ht <= 58 in | Wt 142.0 lb

## 2020-09-05 DIAGNOSIS — Z86010 Personal history of colonic polyps: Secondary | ICD-10-CM | POA: Insufficient documentation

## 2020-09-05 DIAGNOSIS — K30 Functional dyspepsia: Secondary | ICD-10-CM | POA: Diagnosis not present

## 2020-09-05 MED ORDER — PEG 3350-KCL-NA BICARB-NACL 420 G PO SOLR: 4000.0000 mL | Freq: Once | ORAL | 0 refills | Status: DC

## 2020-09-05 MED ORDER — NA SULFATE-K SULFATE-MG SULF 17.5-3.13-1.6 GM/177ML PO SOLN: 354.0000 mL | Freq: Once | ORAL | 0 refills | Status: AC

## 2020-09-05 MED ORDER — BUSPIRONE HCL 5 MG PO TABS: 5.0000 mg | ORAL_TABLET | Freq: Three times a day (TID) | ORAL | 3 refills | Status: DC

## 2020-09-05 NOTE — Patient Instructions (Signed)
Schedule colonoscopy Continue Buspirone 5 mg three times a day for 6 months total

## 2020-09-05 NOTE — Telephone Encounter (Signed)
LeighAnn Lovelace, CMA  

## 2020-09-05 NOTE — Progress Notes (Signed)
Melanie David, M.D. Gastroenterology & Hepatology [facility] [address] [place], [place] [address] Primary Care Physician: Monico Blitz, [place] [address]  This is a phone visit.  It required patient-provider interaction for the medical decision making as documented below. The patient has consented and agreed to proceed with a Telehealth encounter given the current Coronavirus pandemic.  TELEPHONE VISIT NOTE Patient location: home Provider location: office  I will communicate my assessment and recommendations to the referring MD via EMR. Note: Occasional unusual wording and randomly placed punctuation marks may result from the use of speech recognition technology to transcribe this document"  Problems: 1. Functional dyspepsia  History of Present Illness: Melanie David is a 75 y.o. female with past medical history of left breast cancer, osteoporosis and functional dyspepsia, who presents for evaluation of functional dyspepsia.  Patient was last seen on 06/29/2020. At that time the patient was started on Buspar 5 mg TID.  She has been taking this medication compliantly.  She was also counseled to start taking FBGard as needed based on her symptoms but she has not needed to use this.  Patient reports that after starting buspirone 5 mg TID she has felt a major improvement in her abdominal pain. States that she "is extremely surprised and amazed of how effective it was" as she has not had any more episodes of abdominal pain. Has avoided trigger foods as well which has helped. The patient denies having any nausea, vomiting, fever, chills, hematochezia, melena, hematemesis, abdominal distention, diarrhea, jaundice, pruritus or weight loss.  Patient had rotator cuff surgery recently, she is recovering from it steadily.  Not taking NSAIDs.  Last Colonoscopy:08/2015 Prep excellent. 4 mm polyp cold snared from proximal  transverse colon. Altered will diverticula noted at sigmoid colon some of which were large. Normal rectal mucosa. Small hemorrhoids below the dentate line Path - tubular adenoma, 5 year repeat recommended.  Last Endoscopy:EGD 12/2018-small3cmsliding hiatal hernia,healed ulcer in gastric antrum,and nodular bulbar mucosa and biopsy only revealed peptic duodenitis  Past Medical History: Past Medical History:  Diagnosis Date  . Cancer ([place])    Left breast cancer: Lumpectomy  . Osteoporosis     Past Surgical History: Past Surgical History:  Procedure Laterality Date  . ABDOMINAL HYSTERECTOMY    . APPENDECTOMY    . BIOPSY  01/14/2019   Procedure: BIOPSY;  Surgeon: Rogene Houston, MD;  [facility];  Service: Endoscopy;;  . BREAST SURGERY    . CATARACT EXTRACTION W/PHACO  09/22/2012   Procedure: CATARACT EXTRACTION PHACO AND INTRAOCULAR LENS PLACEMENT (IOC);  Surgeon: Tonny Branch, MD;  Location: [facility];  Service: Ophthalmology;  Laterality: Right;  CDE=13.95  . CATARACT EXTRACTION W/PHACO Left 10/06/2012   Procedure: CATARACT EXTRACTION PHACO AND INTRAOCULAR LENS PLACEMENT (IOC);  Surgeon: Tonny Branch, MD;  Location: [facility];  Service: Ophthalmology;  Laterality: Left;  CDE: 14.58  . [place] SURGERY  2010  . CHOLECYSTECTOMY    . COLONOSCOPY N/A 09/01/2015   Procedure: COLONOSCOPY;  Surgeon: Rogene Houston, MD;  [facility];  Service: Endoscopy;  Laterality: N/A;  9:30 - moved to 11:00 - Ann to notify pt  . ESOPHAGOGASTRODUODENOSCOPY N/A 01/14/2019   Procedure: ESOPHAGOGASTRODUODENOSCOPY (EGD);  Surgeon: Rogene Houston, MD;  [facility];  Service: Endoscopy;  Laterality: N/A;  . HERNIA REPAIR      Family History: Family History  Problem Relation Age of Onset  .  Heart disease Father     Social History: Social History   Tobacco Use  Smoking Status Never Smoker  Smokeless Tobacco Never Used   Social History   Substance and Sexual  Activity  Alcohol Use No   Social History   Substance and Sexual Activity  Drug Use No    Allergies: No Known Allergies  Medications: Current Outpatient Medications  Medication Sig Dispense Refill  . acyclovir (ZOVIRAX) 400 MG tablet Take 400 mg by mouth 2 (two) times daily.     . busPIRone (BUSPAR) 5 MG tablet Take 5 mg by mouth 3 (three) times daily.    . Calcium Carb-Cholecalciferol (CALCIUM 600-D PO) Take by mouth daily.    Marland Kitchen amitriptyline (ELAVIL) 25 MG tablet TAKE 1 TABLET(25 MG) BY MOUTH AT BEDTIME (Patient not taking: Reported on 09/05/2020) 30 tablet 3  . pantoprazole (PROTONIX) 40 MG tablet TAKE 1 TABLET(40 MG) BY MOUTH DAILY (Patient not taking: No sig reported) 30 tablet 5   No current facility-administered medications for this visit.    Review of Systems: GENERAL: negative for malaise, night sweats HEENT: No changes in hearing or vision, no nose bleeds or other nasal problems. NECK: Negative for lumps, goiter, pain and significant neck swelling RESPIRATORY: Negative for cough, wheezing CARDIOVASCULAR: Negative for chest pain, leg swelling, palpitations, orthopnea GI: SEE HPI MUSCULOSKELETAL: Negative for joint pain or swelling, back pain, and muscle pain. SKIN: Negative for lesions, rash PSYCH: Negative for sleep disturbance, mood disorder and recent psychosocial stressors. HEMATOLOGY Negative for prolonged bleeding, bruising easily, and swollen nodes. ENDOCRINE: Negative for cold or heat intolerance, polyuria, polydipsia and goiter. NEURO: negative for tremor, gait imbalance, syncope and seizures. The remainder of the review of systems is noncontributory.   Physical Exam: Not performed as this was a phone visit.  Imaging/Labs: as above  I personally reviewed and interpreted the available labs, imaging and endoscopic files.  Impression and Plan: Melanie David is a 75 y.o. female with past medical history of left breast cancer, osteoporosis and functional  dyspepsia, who presents for evaluation of functional dyspepsia.  Patient had a major improvement of her symptomatology after she was started on BuSpar.  Due to this, we will continue medication at the current dose for at least a total of 6 months and based on symptom control will consider stopping it.  Patient understood and agreed.  Finally, she is due for colorectal cancer screening for which a colonoscopy will be ordered as she had tubular adenoma in the past.  - Schedule colonoscopy - C/w Buspirone 5 mg three times a day for 6 months total  All questions were answered.      Total time: I spent a total of  20 minutes  Melanie Peppers, MD Gastroenterology and Hepatology [facility]

## 2020-09-05 NOTE — Telephone Encounter (Signed)
Melanie David, CMA  

## 2020-09-09 ENCOUNTER — Other Ambulatory Visit (INDEPENDENT_AMBULATORY_CARE_PROVIDER_SITE_OTHER): Payer: Self-pay

## 2020-09-16 DIAGNOSIS — M25511 Pain in right shoulder: Secondary | ICD-10-CM | POA: Diagnosis not present

## 2020-09-22 ENCOUNTER — Encounter (INDEPENDENT_AMBULATORY_CARE_PROVIDER_SITE_OTHER): Payer: Self-pay | Admitting: *Deleted

## 2020-09-26 DIAGNOSIS — E7849 Other hyperlipidemia: Secondary | ICD-10-CM | POA: Diagnosis not present

## 2020-09-26 DIAGNOSIS — K219 Gastro-esophageal reflux disease without esophagitis: Secondary | ICD-10-CM | POA: Diagnosis not present

## 2020-09-30 DIAGNOSIS — M25511 Pain in right shoulder: Secondary | ICD-10-CM | POA: Diagnosis not present

## 2020-10-05 ENCOUNTER — Other Ambulatory Visit (HOSPITAL_COMMUNITY): Payer: Medicare Other

## 2020-10-06 DIAGNOSIS — M25511 Pain in right shoulder: Secondary | ICD-10-CM | POA: Diagnosis not present

## 2020-10-07 ENCOUNTER — Encounter (HOSPITAL_COMMUNITY): Payer: Self-pay

## 2020-10-07 ENCOUNTER — Ambulatory Visit (HOSPITAL_COMMUNITY): Admit: 2020-10-07 | Payer: Medicare Other | Admitting: Gastroenterology

## 2020-10-07 SURGERY — COLONOSCOPY WITH PROPOFOL
Anesthesia: Monitor Anesthesia Care

## 2020-10-10 DIAGNOSIS — M25511 Pain in right shoulder: Secondary | ICD-10-CM | POA: Diagnosis not present

## 2020-10-13 DIAGNOSIS — M25511 Pain in right shoulder: Secondary | ICD-10-CM | POA: Diagnosis not present

## 2020-10-14 DIAGNOSIS — M25511 Pain in right shoulder: Secondary | ICD-10-CM | POA: Diagnosis not present

## 2020-10-17 DIAGNOSIS — M25511 Pain in right shoulder: Secondary | ICD-10-CM | POA: Diagnosis not present

## 2020-10-20 DIAGNOSIS — M25511 Pain in right shoulder: Secondary | ICD-10-CM | POA: Diagnosis not present

## 2020-10-24 DIAGNOSIS — K219 Gastro-esophageal reflux disease without esophagitis: Secondary | ICD-10-CM | POA: Diagnosis not present

## 2020-10-24 DIAGNOSIS — M25511 Pain in right shoulder: Secondary | ICD-10-CM | POA: Diagnosis not present

## 2020-10-24 DIAGNOSIS — E7849 Other hyperlipidemia: Secondary | ICD-10-CM | POA: Diagnosis not present

## 2020-10-28 ENCOUNTER — Other Ambulatory Visit (INDEPENDENT_AMBULATORY_CARE_PROVIDER_SITE_OTHER): Payer: Self-pay | Admitting: Gastroenterology

## 2020-10-28 ENCOUNTER — Telehealth (INDEPENDENT_AMBULATORY_CARE_PROVIDER_SITE_OTHER): Payer: Self-pay

## 2020-10-28 ENCOUNTER — Other Ambulatory Visit (INDEPENDENT_AMBULATORY_CARE_PROVIDER_SITE_OTHER): Payer: Self-pay

## 2020-10-28 DIAGNOSIS — R1010 Upper abdominal pain, unspecified: Secondary | ICD-10-CM

## 2020-10-28 DIAGNOSIS — R1013 Epigastric pain: Secondary | ICD-10-CM

## 2020-10-28 DIAGNOSIS — G8929 Other chronic pain: Secondary | ICD-10-CM

## 2020-10-28 MED ORDER — DICYCLOMINE HCL 10 MG PO CAPS: 10.0000 mg | ORAL_CAPSULE | Freq: Two times a day (BID) | ORAL | 2 refills | Status: DC | PRN

## 2020-10-28 NOTE — Telephone Encounter (Signed)
Per patient she did not order this she states this was apparently on automatic refill. She states she no longer takes this one.

## 2020-10-28 NOTE — Telephone Encounter (Signed)
Thanks

## 2020-10-28 NOTE — Telephone Encounter (Addendum)
per patient she is no longer on this. This was an automatic refill.cx with Melony at [facility]  I called and left a detailed message asked that she please return call to confirm she did receive the message.

## 2020-10-28 NOTE — Telephone Encounter (Signed)
I did call the pharmacy and cancel this.

## 2020-10-28 NOTE — Telephone Encounter (Signed)
Ok I thought the patient called for a refill. Can you please call her pharmacy and ask them to cancel the refill?  Thanks

## 2020-10-28 NOTE — Telephone Encounter (Signed)
Ok, please let her know I will refill it but she should only take it if the pain does not improve with the other medications she has been prescribed Thanks

## 2020-10-31 DIAGNOSIS — M25511 Pain in right shoulder: Secondary | ICD-10-CM | POA: Diagnosis not present

## 2020-11-03 DIAGNOSIS — M25511 Pain in right shoulder: Secondary | ICD-10-CM | POA: Diagnosis not present

## 2020-11-07 DIAGNOSIS — M25511 Pain in right shoulder: Secondary | ICD-10-CM | POA: Diagnosis not present

## 2020-11-10 DIAGNOSIS — M25511 Pain in right shoulder: Secondary | ICD-10-CM | POA: Diagnosis not present

## 2020-12-27 DIAGNOSIS — B009 Herpesviral infection, unspecified: Secondary | ICD-10-CM | POA: Diagnosis not present

## 2020-12-27 DIAGNOSIS — L719 Rosacea, unspecified: Secondary | ICD-10-CM | POA: Diagnosis not present

## 2021-01-16 ENCOUNTER — Other Ambulatory Visit (INDEPENDENT_AMBULATORY_CARE_PROVIDER_SITE_OTHER): Payer: Self-pay | Admitting: Internal Medicine

## 2021-01-16 NOTE — Telephone Encounter (Signed)
Last seen 06/29/2020 for Abdominal pain, Thayer Headings.

## 2021-01-19 NOTE — Telephone Encounter (Signed)
Patient was on Buspar on last visit, Tammy called a few days ago and left a message asking her if she was taking it but she never replied. Will reject this refill for now

## 2021-01-31 DIAGNOSIS — Z23 Encounter for immunization: Secondary | ICD-10-CM | POA: Diagnosis not present

## 2021-02-21 ENCOUNTER — Other Ambulatory Visit (INDEPENDENT_AMBULATORY_CARE_PROVIDER_SITE_OTHER): Payer: Self-pay | Admitting: Gastroenterology

## 2021-02-21 DIAGNOSIS — K30 Functional dyspepsia: Secondary | ICD-10-CM

## 2021-03-01 DIAGNOSIS — Z20822 Contact with and (suspected) exposure to covid-19: Secondary | ICD-10-CM | POA: Diagnosis not present

## 2021-03-10 DIAGNOSIS — Z6829 Body mass index (BMI) 29.0-29.9, adult: Secondary | ICD-10-CM | POA: Diagnosis not present

## 2021-03-10 DIAGNOSIS — N644 Mastodynia: Secondary | ICD-10-CM | POA: Diagnosis not present

## 2021-03-10 DIAGNOSIS — Z299 Encounter for prophylactic measures, unspecified: Secondary | ICD-10-CM | POA: Diagnosis not present

## 2021-03-10 DIAGNOSIS — I700 Atherosclerosis of aorta: Secondary | ICD-10-CM | POA: Diagnosis not present

## 2021-03-10 DIAGNOSIS — C799 Secondary malignant neoplasm of unspecified site: Secondary | ICD-10-CM | POA: Diagnosis not present

## 2021-03-10 DIAGNOSIS — R079 Chest pain, unspecified: Secondary | ICD-10-CM | POA: Diagnosis not present

## 2021-03-13 DIAGNOSIS — I700 Atherosclerosis of aorta: Secondary | ICD-10-CM | POA: Diagnosis not present

## 2021-03-13 DIAGNOSIS — R079 Chest pain, unspecified: Secondary | ICD-10-CM | POA: Diagnosis not present

## 2021-03-13 DIAGNOSIS — Z9049 Acquired absence of other specified parts of digestive tract: Secondary | ICD-10-CM | POA: Diagnosis not present

## 2021-04-14 DIAGNOSIS — L237 Allergic contact dermatitis due to plants, except food: Secondary | ICD-10-CM | POA: Diagnosis not present

## 2021-04-14 DIAGNOSIS — K219 Gastro-esophageal reflux disease without esophagitis: Secondary | ICD-10-CM | POA: Diagnosis not present

## 2021-04-14 DIAGNOSIS — Z713 Dietary counseling and surveillance: Secondary | ICD-10-CM | POA: Diagnosis not present

## 2021-04-14 DIAGNOSIS — Z6830 Body mass index (BMI) 30.0-30.9, adult: Secondary | ICD-10-CM | POA: Diagnosis not present

## 2021-04-14 DIAGNOSIS — Z299 Encounter for prophylactic measures, unspecified: Secondary | ICD-10-CM | POA: Diagnosis not present

## 2021-04-19 DIAGNOSIS — L237 Allergic contact dermatitis due to plants, except food: Secondary | ICD-10-CM | POA: Diagnosis not present

## 2021-04-26 DIAGNOSIS — E7849 Other hyperlipidemia: Secondary | ICD-10-CM | POA: Diagnosis not present

## 2021-04-26 DIAGNOSIS — K219 Gastro-esophageal reflux disease without esophagitis: Secondary | ICD-10-CM | POA: Diagnosis not present

## 2021-05-25 ENCOUNTER — Other Ambulatory Visit (INDEPENDENT_AMBULATORY_CARE_PROVIDER_SITE_OTHER): Payer: Self-pay | Admitting: Gastroenterology

## 2021-05-25 ENCOUNTER — Telehealth (INDEPENDENT_AMBULATORY_CARE_PROVIDER_SITE_OTHER): Payer: Self-pay

## 2021-05-25 DIAGNOSIS — K219 Gastro-esophageal reflux disease without esophagitis: Secondary | ICD-10-CM

## 2021-05-25 MED ORDER — PANTOPRAZOLE SODIUM 40 MG PO TBEC: 40.0000 mg | DELAYED_RELEASE_TABLET | Freq: Every day | ORAL | 0 refills | Status: DC

## 2021-05-25 NOTE — Telephone Encounter (Signed)
Will send her a 2 month course of pantoprazole, which may improve her symptoms. Please let her know she is due for a repeat colonoscopy and is past due, needs to have this rescheduled. Thanks

## 2021-05-25 NOTE — Telephone Encounter (Signed)
Patient called today stating for the last few days she has had a burning sensation in the epigastric area. She states she has been eating a bland diet and taking buspar 5 mg TID. She reports she is no longer on a ppi, as she used to take Pantoprazole 40 mg a day. If this needs to be restarted she will need this sent to [facility]. Please advise.

## 2021-05-25 NOTE — Telephone Encounter (Signed)
Patient aware of all. She states she will call the office back in the next couple of weeks after she completes some other appointments.

## 2021-05-26 ENCOUNTER — Other Ambulatory Visit: Payer: Self-pay | Admitting: Internal Medicine

## 2021-05-26 DIAGNOSIS — Z139 Encounter for screening, unspecified: Secondary | ICD-10-CM

## 2021-05-30 DIAGNOSIS — K2100 Gastro-esophageal reflux disease with esophagitis, without bleeding: Secondary | ICD-10-CM | POA: Diagnosis not present

## 2021-05-30 DIAGNOSIS — Z23 Encounter for immunization: Secondary | ICD-10-CM | POA: Diagnosis not present

## 2021-05-30 DIAGNOSIS — E7849 Other hyperlipidemia: Secondary | ICD-10-CM | POA: Diagnosis not present

## 2021-05-30 DIAGNOSIS — Z6832 Body mass index (BMI) 32.0-32.9, adult: Secondary | ICD-10-CM | POA: Diagnosis not present

## 2021-05-30 DIAGNOSIS — L719 Rosacea, unspecified: Secondary | ICD-10-CM | POA: Diagnosis not present

## 2021-05-30 DIAGNOSIS — Z853 Personal history of malignant neoplasm of breast: Secondary | ICD-10-CM | POA: Diagnosis not present

## 2021-05-30 DIAGNOSIS — E782 Mixed hyperlipidemia: Secondary | ICD-10-CM | POA: Diagnosis not present

## 2021-05-31 IMAGING — CT CT ABD-PELV W/ CM
2 of 5 series · 16 of 46 positions shown, 18 images · IV contrast (omnipaque)
Comparison: 10/27/2018 from [facility]HOSPITAL[facility]

CLINICAL DATA: Generalized abdominal pain. Personal history of
breast carcinoma.

EXAM:
CT ABDOMEN AND PELVIS WITH CONTRAST
TECHNIQUE: Multidetector CT imaging of the abdomen and pelvis was performed
using the standard protocol following bolus administration of
intravenous contrast.
CONTRAST:  100mL OMNIPAQUE IOHEXOL 300 MG/ML  SOLN

[Series 2: axial st · axial · 0.67mm/px · z∈[+976,+1346]mm · 13 of 88 slices shown, 15 images]
[im 7/88  soft-tissue]
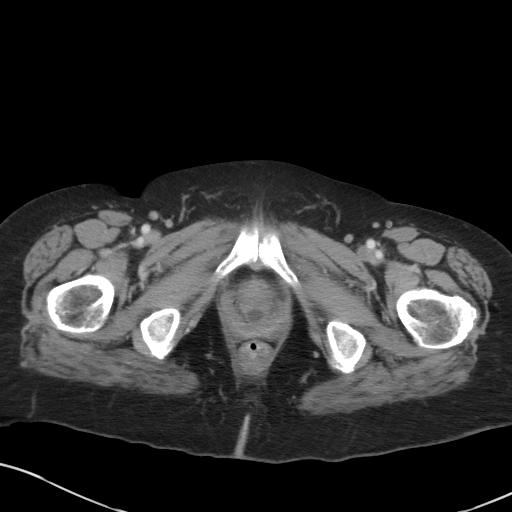
[im 7/88  bone]
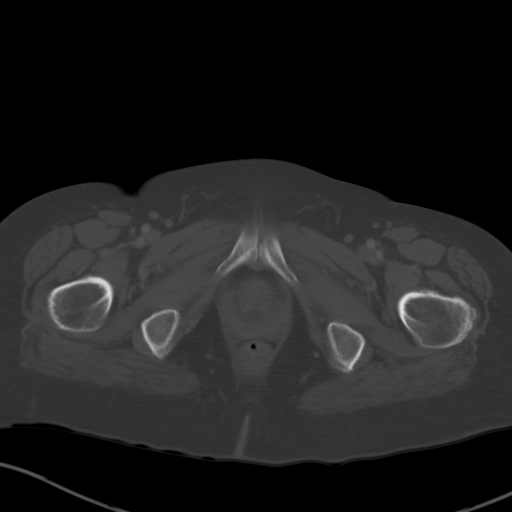
[im 13/88  soft-tissue]
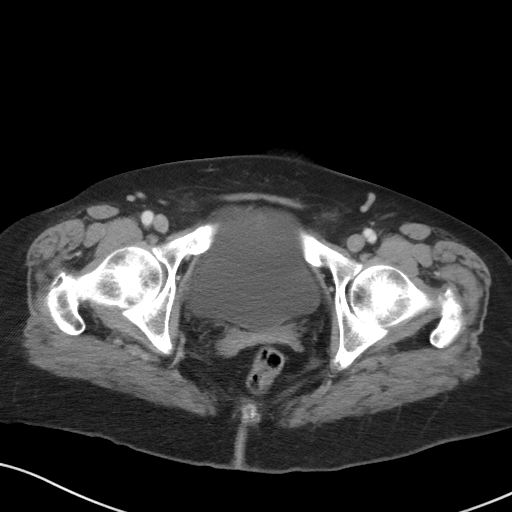
[im 19/88  soft-tissue]
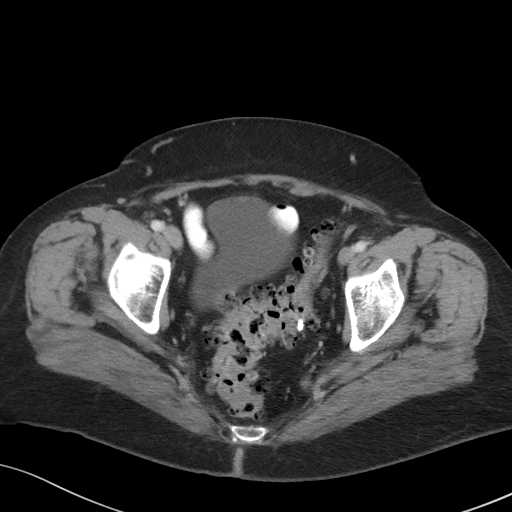
[im 25/88  soft-tissue]
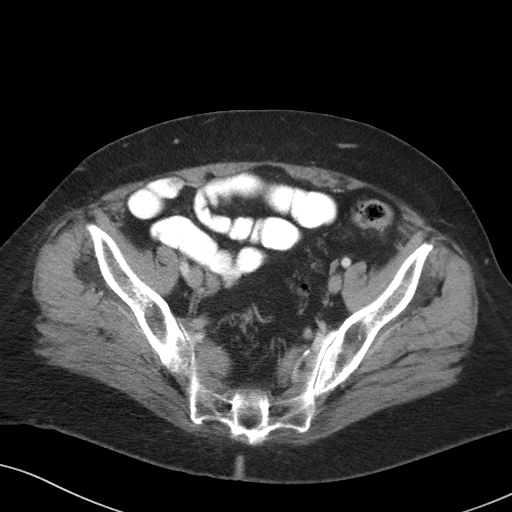
[im 32/88  soft-tissue]
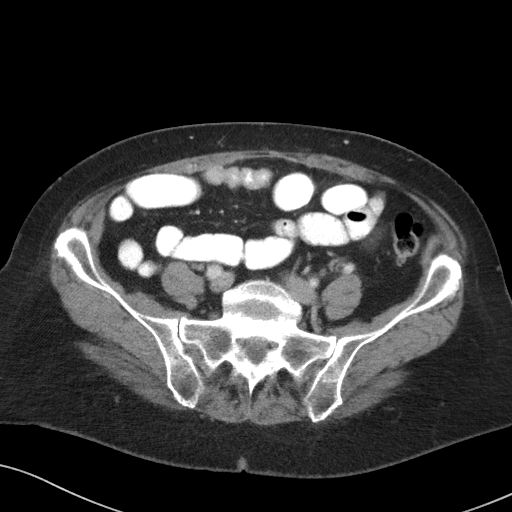
[im 38/88  soft-tissue]
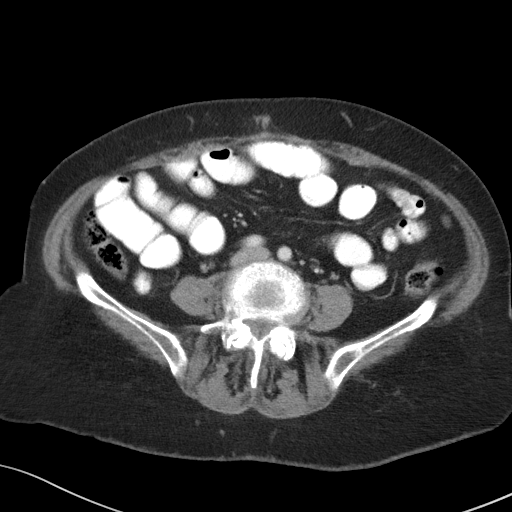
[im 44/88  soft-tissue]
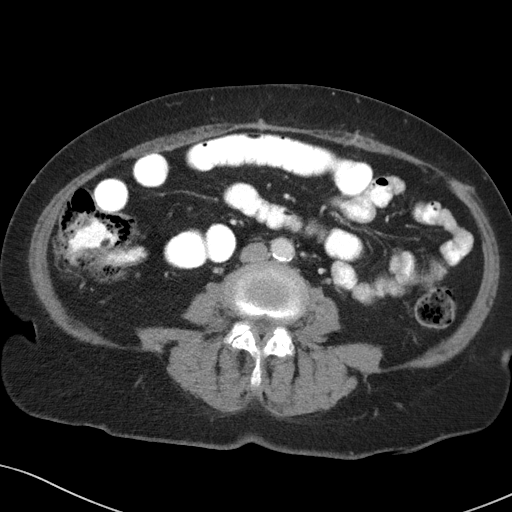
[im 50/88  soft-tissue]
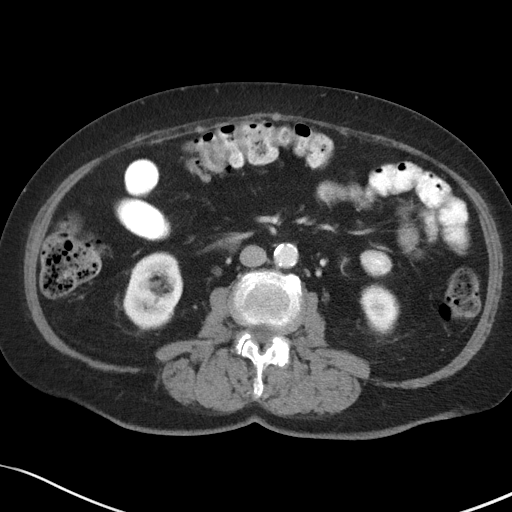
[im 56/88  soft-tissue]
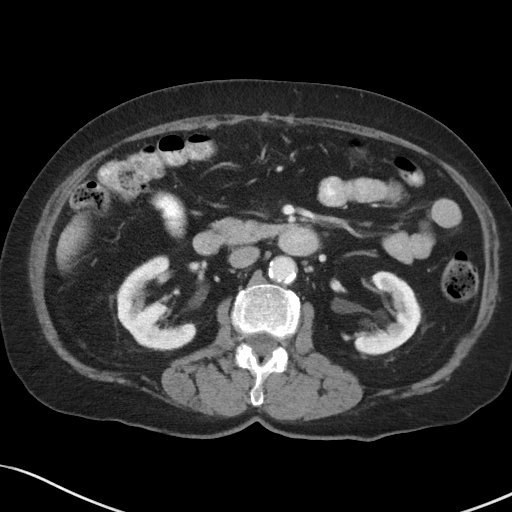
[im 56/88  bone]
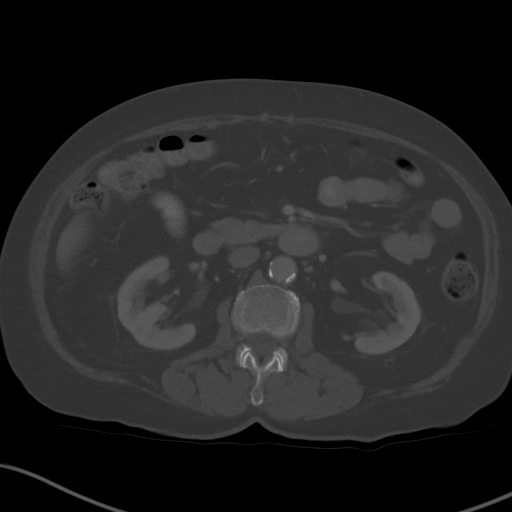
[im 63/88  soft-tissue]
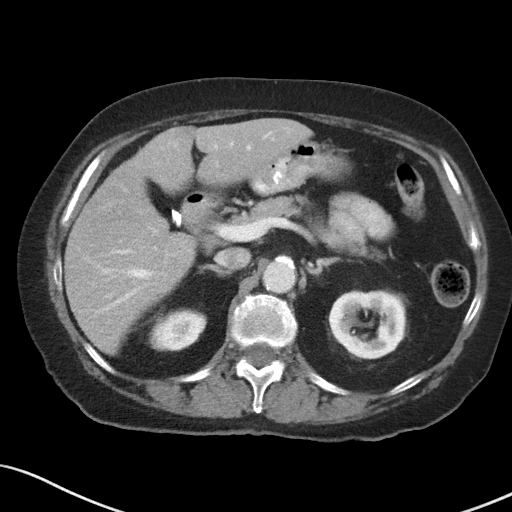
[im 69/88  soft-tissue]
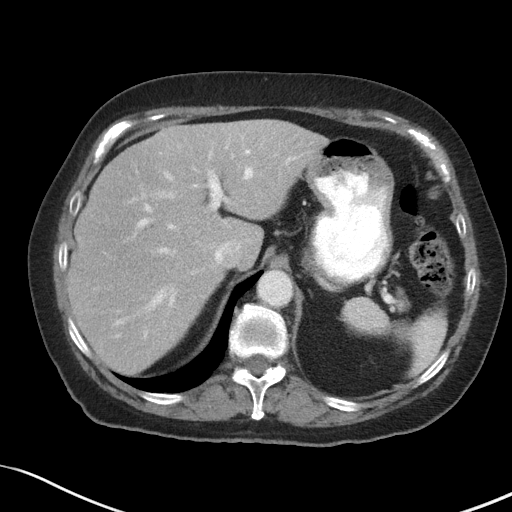
[im 75/88  soft-tissue]
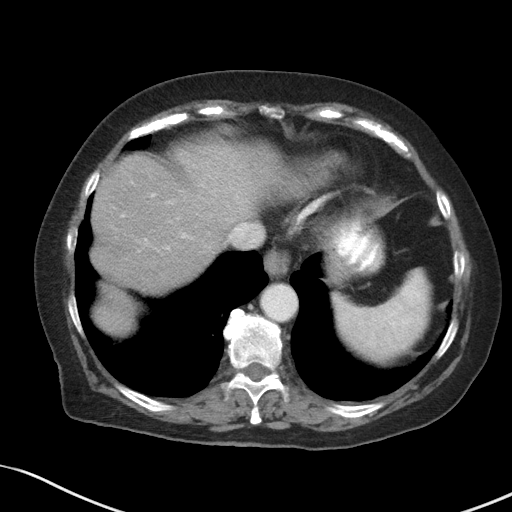
[im 81/88  soft-tissue]
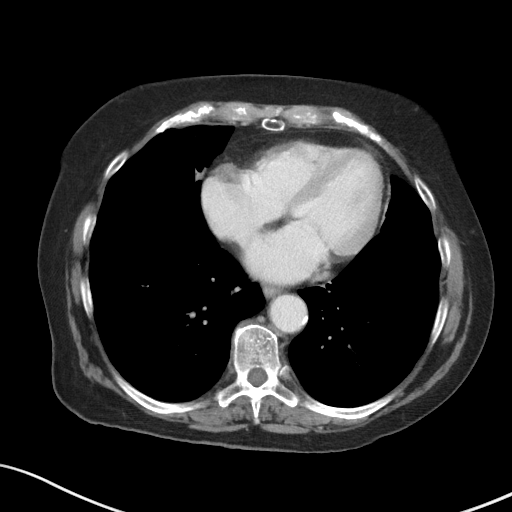

[Series 5: coronal st · coronal · 0.68mm/px · 3 of 86 slices shown]
[im 29/86  soft-tissue]
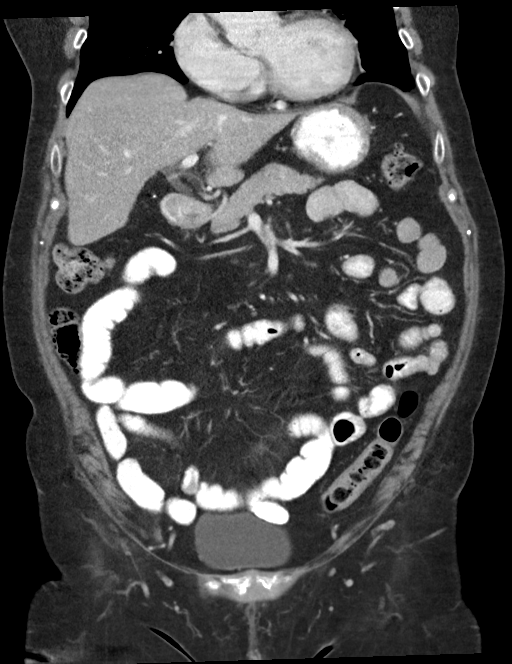
[im 38/86  soft-tissue]
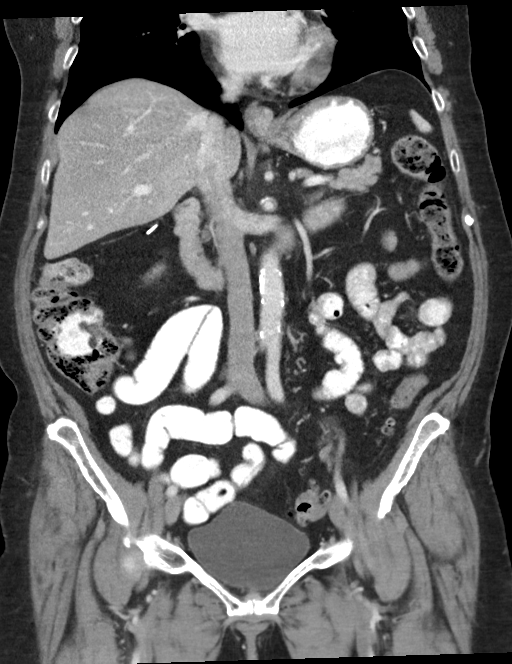
[im 48/86  soft-tissue]
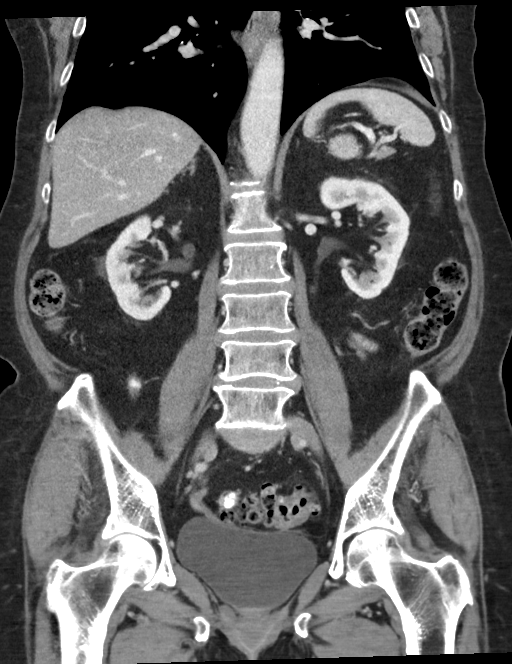

[16 of 46 positions shown; findings below may reference images not displayed]

FINDINGS: Lower Chest: No acute findings.

Hepatobiliary: No hepatic masses identified. The mild diffuse
hepatic steatosis again noted. Prior cholecystectomy. No evidence of
biliary obstruction.

Pancreas:  No mass or inflammatory changes.

Spleen: Within normal limits in size and appearance.

Adrenals/Urinary Tract: No masses identified. No evidence of
hydronephrosis.

Stomach/Bowel: No evidence of obstruction, inflammatory process or
abnormal fluid collections. Diverticulosis is seen mainly involving
the sigmoid colon, however there is no evidence of diverticulitis.

Vascular/Lymphatic: No pathologically enlarged lymph nodes. No
abdominal aortic aneurysm. Aortic atherosclerosis incidentally
noted.

Reproductive: Prior hysterectomy noted. Adnexal regions are
unremarkable in appearance.

Other:  None.

Musculoskeletal:  No suspicious bone lesions identified.
IMPRESSION: 1. Colonic diverticulosis. No radiographic evidence of
diverticulitis or other acute findings.
2. Mild hepatic steatosis.

## 2021-06-08 ENCOUNTER — Telehealth (INDEPENDENT_AMBULATORY_CARE_PROVIDER_SITE_OTHER): Payer: Self-pay

## 2021-06-08 ENCOUNTER — Other Ambulatory Visit (INDEPENDENT_AMBULATORY_CARE_PROVIDER_SITE_OTHER): Payer: Self-pay | Admitting: Gastroenterology

## 2021-06-08 DIAGNOSIS — K219 Gastro-esophageal reflux disease without esophagitis: Secondary | ICD-10-CM

## 2021-06-08 MED ORDER — PANTOPRAZOLE SODIUM 40 MG PO TBEC: 40.0000 mg | DELAYED_RELEASE_TABLET | Freq: Two times a day (BID) | ORAL | 0 refills | Status: DC

## 2021-06-08 NOTE — Telephone Encounter (Signed)
Patient called today stating she has still been having pain in stomach and saw pcp whom recommended that she increase her pantoprazole to bid, he advised her to call here to see if you would be willing to have this sent in for her to [facility]. Please advise.

## 2021-06-08 NOTE — Telephone Encounter (Signed)
Will send prescription for 3 months only, will need to follow up in the clinic for more refills

## 2021-06-09 NOTE — Telephone Encounter (Signed)
Called and left a detailed message that we sent in the medication, she will need to call the office to set up a follow up appointment.

## 2021-06-20 ENCOUNTER — Other Ambulatory Visit (INDEPENDENT_AMBULATORY_CARE_PROVIDER_SITE_OTHER): Payer: Self-pay | Admitting: Gastroenterology

## 2021-06-20 ENCOUNTER — Telehealth (INDEPENDENT_AMBULATORY_CARE_PROVIDER_SITE_OTHER): Payer: Self-pay

## 2021-06-20 DIAGNOSIS — R1013 Epigastric pain: Secondary | ICD-10-CM

## 2021-06-20 DIAGNOSIS — G8929 Other chronic pain: Secondary | ICD-10-CM

## 2021-06-20 MED ORDER — SUCRALFATE 1 GM/10ML PO SUSP
1.0000 g | Freq: Two times a day (BID) | ORAL | 0 refills | Status: DC
Start: 2021-06-20 — End: 2021-08-03

## 2021-06-20 NOTE — Telephone Encounter (Signed)
Melanie David please set up for Egd . Thanks  Patient wants to proceed with both the EGD and the Carafate.

## 2021-06-20 NOTE — Telephone Encounter (Signed)
Patient called today stating she is still having epi gastric pain constantly night and day. She also has issues with bloat, she is on a low carb diet and still having issues. She has a history of H pylori and had Carafate in the past which seems to have helped.She is on Pantoprazole bid, no heartburn symptoms. She would like to know if you think the Carafate would help her now. If so please send to [facility]. Thanks.

## 2021-06-20 NOTE — Telephone Encounter (Signed)
Hi Crystal,  the patient has failed multiple medications.  Had some improvement with buspirone but this has not shown to be beneficial and her PPI is not making her feel better.  She can stop the PPI for now.  I can prescribe some Carafate for a brief period but if she is interested we can evaluate her symptoms further with a repeat EGD and she will need a follow-up appointment in the clinic for any other changes in her regimen.

## 2021-06-21 ENCOUNTER — Encounter (INDEPENDENT_AMBULATORY_CARE_PROVIDER_SITE_OTHER): Payer: Self-pay

## 2021-06-21 ENCOUNTER — Other Ambulatory Visit (INDEPENDENT_AMBULATORY_CARE_PROVIDER_SITE_OTHER): Payer: Self-pay

## 2021-06-21 DIAGNOSIS — K30 Functional dyspepsia: Secondary | ICD-10-CM

## 2021-06-27 DIAGNOSIS — B009 Herpesviral infection, unspecified: Secondary | ICD-10-CM | POA: Diagnosis not present

## 2021-06-27 DIAGNOSIS — L719 Rosacea, unspecified: Secondary | ICD-10-CM | POA: Diagnosis not present

## 2021-07-03 DIAGNOSIS — Z0001 Encounter for general adult medical examination with abnormal findings: Secondary | ICD-10-CM | POA: Diagnosis not present

## 2021-07-03 DIAGNOSIS — E7849 Other hyperlipidemia: Secondary | ICD-10-CM | POA: Diagnosis not present

## 2021-07-03 DIAGNOSIS — Z853 Personal history of malignant neoplasm of breast: Secondary | ICD-10-CM | POA: Diagnosis not present

## 2021-07-03 DIAGNOSIS — Z6832 Body mass index (BMI) 32.0-32.9, adult: Secondary | ICD-10-CM | POA: Diagnosis not present

## 2021-07-05 ENCOUNTER — Encounter (HOSPITAL_COMMUNITY)
Admission: RE | Disposition: A | Discharge status: Home / Self Care | Payer: Self-pay | Source: Ambulatory Visit | Attending: Gastroenterology

## 2021-07-05 ENCOUNTER — Ambulatory Visit (HOSPITAL_COMMUNITY): Payer: Medicare Other | Admitting: Anesthesiology

## 2021-07-05 ENCOUNTER — Encounter (HOSPITAL_COMMUNITY): Payer: Self-pay | Admitting: Gastroenterology

## 2021-07-05 ENCOUNTER — Ambulatory Visit (HOSPITAL_COMMUNITY)
Admission: RE | Admit: 2021-07-05 | Discharge: 2021-07-05 | Disposition: A | Discharge status: Home / Self Care | Payer: Medicare Other | Source: Ambulatory Visit | Attending: Gastroenterology | Admitting: Gastroenterology

## 2021-07-05 ENCOUNTER — Other Ambulatory Visit: Payer: Self-pay

## 2021-07-05 DIAGNOSIS — K259 Gastric ulcer, unspecified as acute or chronic, without hemorrhage or perforation: Secondary | ICD-10-CM | POA: Insufficient documentation

## 2021-07-05 DIAGNOSIS — K30 Functional dyspepsia: Secondary | ICD-10-CM

## 2021-07-05 DIAGNOSIS — K449 Diaphragmatic hernia without obstruction or gangrene: Secondary | ICD-10-CM

## 2021-07-05 DIAGNOSIS — Z853 Personal history of malignant neoplasm of breast: Secondary | ICD-10-CM | POA: Diagnosis not present

## 2021-07-05 DIAGNOSIS — K319 Disease of stomach and duodenum, unspecified: Secondary | ICD-10-CM | POA: Diagnosis not present

## 2021-07-05 DIAGNOSIS — M810 Age-related osteoporosis without current pathological fracture: Secondary | ICD-10-CM | POA: Diagnosis not present

## 2021-07-05 DIAGNOSIS — K3189 Other diseases of stomach and duodenum: Secondary | ICD-10-CM | POA: Diagnosis not present

## 2021-07-05 DIAGNOSIS — K2980 Duodenitis without bleeding: Secondary | ICD-10-CM | POA: Insufficient documentation

## 2021-07-05 DIAGNOSIS — R1013 Epigastric pain: Secondary | ICD-10-CM

## 2021-07-05 DIAGNOSIS — C50912 Malignant neoplasm of unspecified site of left female breast: Secondary | ICD-10-CM | POA: Diagnosis not present

## 2021-07-05 HISTORY — PX: ESOPHAGOGASTRODUODENOSCOPY (EGD) WITH PROPOFOL: SHX5813

## 2021-07-05 HISTORY — PX: BIOPSY: SHX5522

## 2021-07-05 SURGERY — ESOPHAGOGASTRODUODENOSCOPY (EGD) WITH PROPOFOL
Anesthesia: General

## 2021-07-05 MED ORDER — PROPOFOL 10 MG/ML IV BOLUS
INTRAVENOUS | Status: AC
  Filled 2021-07-05: qty 20

## 2021-07-05 MED ORDER — OMEPRAZOLE 40 MG PO CPDR: 40.0000 mg | DELAYED_RELEASE_CAPSULE | Freq: Two times a day (BID) | ORAL | 0 refills | Status: DC

## 2021-07-05 MED ORDER — PROPOFOL 10 MG/ML IV BOLUS
INTRAVENOUS | Status: DC | PRN
  Administered 2021-07-05: 100 mg via INTRAVENOUS
  Administered 2021-07-05 (×2): 50 mg via INTRAVENOUS

## 2021-07-05 MED ORDER — PROPOFOL 500 MG/50ML IV EMUL
INTRAVENOUS | Status: AC
  Filled 2021-07-05: qty 50

## 2021-07-05 MED ORDER — LACTATED RINGERS IV SOLN: INTRAVENOUS | Status: DC

## 2021-07-05 MED ORDER — LIDOCAINE HCL (CARDIAC) PF 100 MG/5ML IV SOSY
PREFILLED_SYRINGE | INTRAVENOUS | Status: DC | PRN
  Administered 2021-07-05: 80 mg via INTRAVENOUS

## 2021-07-05 MED ORDER — LACTATED RINGERS IV SOLN: INTRAVENOUS | Status: DC | PRN

## 2021-07-05 NOTE — Anesthesia Postprocedure Evaluation (Signed)
Anesthesia Post Note  Patient: Melanie David  Procedure(s) Performed: ESOPHAGOGASTRODUODENOSCOPY (EGD) WITH PROPOFOL BIOPSY  Patient location during evaluation: Endoscopy Anesthesia Type: General Level of consciousness: awake and alert and oriented Pain management: pain level controlled Vital Signs Assessment: post-procedure vital signs reviewed and stable Respiratory status: spontaneous breathing, nonlabored ventilation and respiratory function stable Cardiovascular status: blood pressure returned to baseline and stable Postop Assessment: no apparent nausea or vomiting Anesthetic complications: no   No notable events documented.   Last Vitals:  Vitals:   07/05/21 1142 07/05/21 1147  BP: (!) 98/34 113/60  Pulse: 72 75  Resp: 18 16  Temp: 36.7 C   SpO2: 97% 99%    Last Pain:  Vitals:   07/05/21 1147  TempSrc:   PainSc: 0-No pain                 Virgie Chery C Batsheva Stevick

## 2021-07-05 NOTE — Discharge Instructions (Addendum)
You are being discharged to home.  Resume your previous diet.  Take Prilosec (omeprazole) 40 mg by mouth twice a day for two months.  We are waiting for your pathology results. If negative findings for H. pylori, may need to confirm this with breath test after being off PPI for 2 weeks. - Follow up in clinic in 4 weeks You are being discharged to home.  Can consider stopping doxycycline.

## 2021-07-05 NOTE — Anesthesia Preprocedure Evaluation (Addendum)
Anesthesia Evaluation  Patient identified by MRN, date of birth, ID band Patient awake    Reviewed: Allergy & Precautions, NPO status , Patient's Chart, lab work & pertinent test results  Airway Mallampati: II  TM Distance: >3 FB Neck ROM: Full    Dental  (+) Upper Dentures, Lower Dentures   Pulmonary neg pulmonary ROS,    Pulmonary exam normal breath sounds clear to auscultation       Cardiovascular negative cardio ROS Normal cardiovascular exam Rhythm:Regular Rate:Normal     Neuro/Psych  Neuromuscular disease (cervical disc disease, neuropathy) negative psych ROS   GI/Hepatic Neg liver ROS, GERD  Medicated,  Endo/Other  negative endocrine ROS  Renal/GU negative Renal ROS  negative genitourinary   Musculoskeletal negative musculoskeletal ROS (+)   Abdominal   Peds negative pediatric ROS (+)  Hematology negative hematology ROS (+)   Anesthesia Other Findings Let breast cancer  Reproductive/Obstetrics negative OB ROS                            Anesthesia Physical Anesthesia Plan  ASA: 2  Anesthesia Plan: General   Post-op Pain Management:    Induction: Intravenous  PONV Risk Score and Plan: TIVA  Airway Management Planned: Nasal Cannula and Natural Airway  Additional Equipment:   Intra-op Plan:   Post-operative Plan:   Informed Consent: I have reviewed the patients History and Physical, chart, labs and discussed the procedure including the risks, benefits and alternatives for the proposed anesthesia with the patient or authorized representative who has indicated his/her understanding and acceptance.       Plan Discussed with: CRNA and Surgeon  Anesthesia Plan Comments:        Anesthesia Quick Evaluation

## 2021-07-05 NOTE — H&P (Signed)
Melanie David is an 75 y.o. female.   Chief Complaint: abdominal pain HPI: 75 y/o F with PMH breast cancer status postlumpectomy, osteoporosis and functional dyspepsia, need for evaluation of recurrent pain in her epigastric area.  Patient tried BuSpar 5 mg 3 times daily with improvement but has presented recurrent symptoms which have not improved with PPI and Carafate.  Past Medical History:  Diagnosis Date   Cancer [facility])    Left breast cancer: Lumpectomy   Osteoporosis     Past Surgical History:  Procedure Laterality Date   ABDOMINAL HYSTERECTOMY     APPENDECTOMY     BIOPSY  01/14/2019   Procedure: BIOPSY;  Surgeon: Rogene Houston, MD;  [facility];  Service: Endoscopy;;   BREAST SURGERY     CATARACT EXTRACTION W/PHACO  09/22/2012   Procedure: CATARACT EXTRACTION PHACO AND INTRAOCULAR LENS PLACEMENT ([place]);  Surgeon: Tonny Branch, MD;  Location: [facility];  Service: Ophthalmology;  Laterality: Right;  CDE=13.95   CATARACT EXTRACTION W/PHACO Left 10/06/2012   Procedure: CATARACT EXTRACTION PHACO AND INTRAOCULAR LENS PLACEMENT (IOC);  Surgeon: Tonny Branch, MD;  Location: [facility];  Service: Ophthalmology;  Laterality: Left;  CDE: 14.58   CERVICAL DISC SURGERY  2010   CHOLECYSTECTOMY     COLONOSCOPY N/A 09/01/2015   Procedure: COLONOSCOPY;  Surgeon: Rogene Houston, MD;  [facility];  Service: Endoscopy;  Laterality: N/A;  9:30 - moved to 11:00 - Ann to notify pt   ESOPHAGOGASTRODUODENOSCOPY N/A 01/14/2019   Procedure: ESOPHAGOGASTRODUODENOSCOPY (EGD);  Surgeon: Rogene Houston, MD;  [facility];  Service: Endoscopy;  Laterality: N/A;   HERNIA REPAIR      Family History  Problem Relation Age of Onset   Heart disease Father    Social History:  reports that she has never smoked. She has never used smokeless tobacco. She reports that she does not drink alcohol and does not use drugs.  Allergies: No Known Allergies  Medications Prior to Admission   Medication Sig Dispense Refill   acetaminophen (TYLENOL) 500 MG tablet Take 500 mg by mouth every 6 (six) hours as needed (for pain.).     busPIRone (BUSPAR) 5 MG tablet TAKE 1 TABLET BY MOUTH THREE TIMES DAILY 90 tablet 3   pantoprazole (PROTONIX) 40 MG tablet Take 1 tablet (40 mg total) by mouth 2 (two) times daily. 180 tablet 0   acyclovir (ZOVIRAX) 400 MG tablet Take 400 mg by mouth daily as needed (fever blisters).     doxycycline (VIBRAMYCIN) 50 MG capsule Take 50 mg by mouth daily.     sucralfate (CARAFATE) 1 GM/10ML suspension Take 10 mLs (1 g total) by mouth 2 (two) times daily. 600 mL 0    No results found for this or any previous visit (from the past 48 hour(s)). No results found.  Review of Systems  Constitutional: Negative.   HENT: Negative.    Eyes: Negative.   Respiratory: Negative.    Cardiovascular: Negative.   Gastrointestinal:  Positive for abdominal pain.  Endocrine: Negative.   Genitourinary: Negative.   Musculoskeletal: Negative.   Allergic/Immunologic: Negative.   Neurological: Negative.   Hematological: Negative.   Psychiatric/Behavioral: Negative.     Blood pressure (!) 153/85, pulse 87, temperature 98.2 F (36.8 C), temperature source Oral, resp. rate 12, height 4\' 7"  (1.397 m), weight 64.4 kg, SpO2 99 %. Physical Exam  GENERAL: The patient is AO x3, in no acute distress. HEENT: Head is normocephalic and atraumatic. EOMI  are intact. Mouth is well hydrated and without lesions. NECK: Supple. No masses LUNGS: Clear to auscultation. No presence of rhonchi/wheezing/rales. Adequate chest expansion HEART: RRR, normal s1 and s2. ABDOMEN: Soft, nontender, no guarding, no peritoneal signs, and nondistended. BS +. No masses. EXTREMITIES: Without any cyanosis, clubbing, rash, lesions or edema. NEUROLOGIC: AOx3, no focal motor deficit. SKIN: no jaundice, no rashes  Assessment/Plan 74 y/o F with PMH breast cancer status postlumpectomy, osteoporosis and  functional dyspepsia, need for evaluation of recurrent pain in her epigastric area.  We will proceed with EGD.  Harvel Quale, MD 07/05/2021, 11:24 AM

## 2021-07-05 NOTE — Transfer of Care (Signed)
Immediate Anesthesia Transfer of Care Note  Patient: Melanie David  Procedure(s) Performed: ESOPHAGOGASTRODUODENOSCOPY (EGD) WITH PROPOFOL BIOPSY  Patient Location: PACU  Anesthesia Type:MAC  Level of Consciousness: awake, alert  and oriented  Airway & Oxygen Therapy: Patient Spontanous Breathing  Post-op Assessment: Report given to RN and Post -op Vital signs reviewed and stable  Post vital signs: Reviewed and stable  Last Vitals:  Vitals Value Taken Time  BP 113/60 07/05/21 1147  Temp 36.7 C 07/05/21 1142  Pulse 75 07/05/21 1147  Resp 16 07/05/21 1147  SpO2 99 % 07/05/21 1147    Last Pain:  Vitals:   07/05/21 1147  TempSrc:   PainSc: 0-No pain      Patients Stated Pain Goal: 7 (18/29/93 7169)  Complications: No notable events documented.

## 2021-07-05 NOTE — Addendum Note (Signed)
Addendum  created 07/05/21 1356 by Tressie Ellis, CRNA   Charge Capture section accepted

## 2021-07-05 NOTE — Op Note (Addendum)
[facility] Patient Name: Melanie David Procedure Date: 07/05/2021 10:15 AM MRN: [id number] Date of Birth: December 20, 1945 Attending MD: [name] ,  CSN: [id number] Age: 75 Admit Type: Outpatient Procedure:                Upper GI endoscopy Indications:              Epigastric abdominal pain Providers:                [name], Janeece Riggers, RN, Kristine L.                            Risa Grill, Technician Referring MD:              Medicines:                Monitored Anesthesia Care Complications:            No immediate complications. Estimated Blood Loss:     Estimated blood loss: none. Procedure:                Pre-Anesthesia Assessment:                           - Prior to the procedure, a History and Physical                            was performed, and patient medications, allergies                            and sensitivities were reviewed. The patient's                            tolerance of previous anesthesia was reviewed.                           - The risks and benefits of the procedure and the                            sedation options and risks were discussed with the                            patient. All questions were answered and informed                            consent was obtained.                           - ASA Grade Assessment: III - A patient with severe                            systemic disease.                           After obtaining informed consent, the endoscope was                            passed under direct vision. Throughout the  procedure, the patient's blood pressure, pulse, and                            oxygen saturations were monitored continuously. The                            [id number]-H190 ([id number]) scope was introduced through the                            mouth, and advanced to the second part of duodenum.                            The upper GI endoscopy was accomplished without                             difficulty. The patient tolerated the procedure                            well. Scope In: 11:31:39 AM Scope Out: 14:78:29 AM Total Procedure Duration: 0 hours 5 minutes 7 seconds  Findings:      A 2 cm hiatal hernia was present.      Two localized small erosions with no stigmata of recent bleeding were       found in the gastric antrum. Biopsies were taken with a cold forceps for       Helicobacter pylori testing.      Diffuse nodular mucosa was found in the duodenal bulb. This was biopsied       in previous EGD and was consistent with peptic duodenitis and Brunner       gland hyperplasia. Has a similar appearance.      The exam of the duodenum was otherwise normal. Impression:               - 2 cm hiatal hernia.                           - Erosive gastropathy with no stigmata of recent                            bleeding. Biopsied.                           - Nodular mucosa in the duodenal bulb due to peptic                            duodenitis and Brunner gland hyperplasia. Moderate Sedation:      Per Anesthesia Care Recommendation:           - Discharge patient to home (ambulatory).                           - Resume previous diet.                           - Use Prilosec (omeprazole) 40 mg PO BID for 2  months.                           - Continue buspirone at same dose.                           - Await pathology results. If negative findings for                            H. pylori, may need to confirm this with breath                            test after being off PPI for 2 weeks.                           - Follow up in clinic in 4 weeks                           -Can consider stopping doxycycline. Procedure Code(s):        --- Professional ---                           [phone], Esophagogastroduodenoscopy, flexible,                            transoral; with biopsy, single or multiple Diagnosis Code(s):        --- Professional ---                            K44.9, Diaphragmatic hernia without obstruction or                            gangrene                           K31.89, Other diseases of stomach and duodenum                           R10.13, Epigastric pain CPT copyright 2019 American Medical Association. All rights reserved. The codes documented in this report are preliminary and upon coder review may  be revised to meet current compliance requirements. [name], MD [name],  07/05/2021 11:46:21 AM This report has been signed electronically. Number of Addenda: 0

## 2021-07-07 LAB — SURGICAL PATHOLOGY

## 2021-07-10 ENCOUNTER — Encounter (HOSPITAL_COMMUNITY): Payer: Self-pay | Admitting: Gastroenterology

## 2021-07-10 ENCOUNTER — Other Ambulatory Visit (INDEPENDENT_AMBULATORY_CARE_PROVIDER_SITE_OTHER): Payer: Self-pay | Admitting: Gastroenterology

## 2021-07-10 ENCOUNTER — Telehealth (INDEPENDENT_AMBULATORY_CARE_PROVIDER_SITE_OTHER): Payer: Self-pay

## 2021-07-10 DIAGNOSIS — B9681 Helicobacter pylori [H. pylori] as the cause of diseases classified elsewhere: Secondary | ICD-10-CM

## 2021-07-10 NOTE — Telephone Encounter (Signed)
Error

## 2021-07-26 DIAGNOSIS — B9681 Helicobacter pylori [H. pylori] as the cause of diseases classified elsewhere: Secondary | ICD-10-CM | POA: Diagnosis not present

## 2021-07-26 DIAGNOSIS — K2970 Gastritis, unspecified, without bleeding: Secondary | ICD-10-CM | POA: Diagnosis not present

## 2021-07-27 LAB — H. PYLORI BREATH TEST: H. pylori Breath Test: NOT DETECTED

## 2021-07-28 DIAGNOSIS — Z6832 Body mass index (BMI) 32.0-32.9, adult: Secondary | ICD-10-CM | POA: Diagnosis not present

## 2021-07-28 DIAGNOSIS — S29019A Strain of muscle and tendon of unspecified wall of thorax, initial encounter: Secondary | ICD-10-CM | POA: Diagnosis not present

## 2021-08-03 ENCOUNTER — Other Ambulatory Visit (INDEPENDENT_AMBULATORY_CARE_PROVIDER_SITE_OTHER): Payer: Self-pay

## 2021-08-03 ENCOUNTER — Encounter (INDEPENDENT_AMBULATORY_CARE_PROVIDER_SITE_OTHER): Payer: Self-pay

## 2021-08-03 ENCOUNTER — Encounter (INDEPENDENT_AMBULATORY_CARE_PROVIDER_SITE_OTHER): Payer: Self-pay | Admitting: Gastroenterology

## 2021-08-03 ENCOUNTER — Telehealth (INDEPENDENT_AMBULATORY_CARE_PROVIDER_SITE_OTHER): Payer: Self-pay

## 2021-08-03 ENCOUNTER — Other Ambulatory Visit: Payer: Self-pay

## 2021-08-03 ENCOUNTER — Ambulatory Visit (INDEPENDENT_AMBULATORY_CARE_PROVIDER_SITE_OTHER): Payer: Medicare Other | Admitting: Gastroenterology

## 2021-08-03 VITALS — BP 125/80 | HR 69 | Temp 98.7°F | Ht <= 58 in | Wt 144.5 lb

## 2021-08-03 DIAGNOSIS — K30 Functional dyspepsia: Secondary | ICD-10-CM

## 2021-08-03 DIAGNOSIS — Z86010 Personal history of colonic polyps: Secondary | ICD-10-CM

## 2021-08-03 DIAGNOSIS — Z1211 Encounter for screening for malignant neoplasm of colon: Secondary | ICD-10-CM

## 2021-08-03 DIAGNOSIS — Z20822 Contact with and (suspected) exposure to covid-19: Secondary | ICD-10-CM | POA: Diagnosis not present

## 2021-08-03 MED ORDER — PEG 3350-KCL-NA BICARB-NACL 420 G PO SOLR: 4000.0000 mL | ORAL | 0 refills | Status: DC

## 2021-08-03 NOTE — Patient Instructions (Signed)
I'm so glad you are feeling better!  Please continue your omeprazole 40mg  BID and Buspar 5mg  TID at this time, at your next visit we will discuss possibly tapering down your dose of omeprazole and maybe stopping Buspar if symptoms are still well controlled.  Please continue to avoid trigger foods. Please let me know if you have new or worsening symptoms, otherwise we will plan to have you follow up again in about 1 month to evaluate how you are feeling and medication regimen.

## 2021-08-03 NOTE — Telephone Encounter (Signed)
LeighAnn Joshlyn Beadle, CMA  

## 2021-08-03 NOTE — Progress Notes (Addendum)
Referring Provider: Manon Hilding, MD Primary Care Physician:  Manon Hilding, MD Primary GI Physician: Jenetta Downer  Chief Complaint  Patient presents with   Follow-up    Patient here today for a four week follow up. She states she is doing a lot better on current medication. On Omeprazole 40 mg BID.    HPI:   Melanie David is a 75 y.o. female with past medical history of left breast cancer, osteoporosis and functional dyspepsia.   Patient presenting today for follow up of functional dyspepsia  Patient last seen in clinic  09/05/20, she was started on Buspar 5mg  TID in Nov 2021 and advised to start FBgard PRN for her symptoms. She reportedly had improvement in her epigastric pain with the initiation of Buspar, however, in march, she began having epigastric pain again. She was started on 2 month course of pantoprazole, however, she continued to have epigastric pain. Carafate 1g BID was added to her regimen in October and PPI was stopped, but pain continued. It was recommended that she undergo EGD for further evaluation of her symptoms, this was done 07/10/21 with findings of erosive gastropathy, peptic duodenitis and Brunner gland hyperplasia, Negative H pylori, and neg H pylori Breath test thereafter. She was started on 8 week course of Omeprazole 40mg  BID and advised to continue Buspar 5mg  TID.   Patient states she is doing much better since starting omeprazole 40mg  BID. She is still trying to avoid trigger foods such as tomato sauces. She is able to eat some foods that she previously had to avoid like chocolate, though she has switched to white chocolate as this seems to be better tolerated. She reports that she rarely has epigastric pain now. Occasionally forgets her buspar dose but does not seem to notice any difference in symptoms when this occurs. She has occasional bloating but this seems to be related to overeating. She occasionally takes a tums if she over eats, but previously was taking  multiple tums per day. She denies indigestion or acid regurgitation. Bowel movements are regular, without constipation or diarrhea.  No red flag symptoms. Patient denies melena, hematochezia, nausea, vomiting, diarrhea, constipation, dysphagia, odyonophagia, early satiety or weight loss.   She is due for repeat surveillance colonoscopy r/t hx of tubular adenoma and would like to get this scheduled.  Last Colonoscopy:08/2015 1mm polyp proximal transverse colon, altered will diverticula noted at sigmoid colon some of which were large, normal rectal mucosa, small hemorrhoids below dentate line, path-tubular adenoma Last Endoscopy:07/05/21- 2 cm hiatal hernia. - Erosive gastropathy with no stigmata of recent bleeding. (No h pylori)  - Nodular mucosa in the duodenal bulb due to peptic duodenitis and Brunner gland hyperplasia.  Recommendations:  Repeat colonoscopy due  Past Medical History:  Diagnosis Date   Cancer [facility])    Left breast cancer: Lumpectomy   Osteoporosis     Past Surgical History:  Procedure Laterality Date   ABDOMINAL HYSTERECTOMY     APPENDECTOMY     BIOPSY  01/14/2019   Procedure: BIOPSY;  Surgeon: Rogene Houston, MD;  Location: [facility];  Service: Endoscopy;;   BIOPSY  07/05/2021   Procedure: BIOPSY;  Surgeon: Harvel Quale, MD;  Location: [facility];  Service: Gastroenterology;;   BREAST SURGERY     CATARACT EXTRACTION W/PHACO  09/22/2012   Procedure: CATARACT EXTRACTION PHACO AND INTRAOCULAR LENS PLACEMENT ([place]);  Surgeon: Tonny Branch, MD;  Location: [facility] OR[facility];  Service: Ophthalmology;  Laterality: Right;  CDE=13.95  CATARACT EXTRACTION W/PHACO Left 10/06/2012   Procedure: CATARACT EXTRACTION PHACO AND INTRAOCULAR LENS PLACEMENT ([facility]);  Surgeon: Tonny Branch, MD;  Location: [facility];  Service: Ophthalmology;  Laterality: Left;  CDE: 14.58   CERVICAL DISC SURGERY  2010   CHOLECYSTECTOMY     COLONOSCOPY N/A 09/01/2015   Procedure: COLONOSCOPY;  Surgeon:  Rogene Houston, MD;  [facility]: [facility];  Service: Endoscopy;  Laterality: N/A;  9:30 - moved to 11:00 - Ann to notify pt   ESOPHAGOGASTRODUODENOSCOPY N/A 01/14/2019   Procedure: ESOPHAGOGASTRODUODENOSCOPY (EGD);  Surgeon: Rogene Houston, MD;  [facility];  Service: Endoscopy;  Laterality: N/A;   ESOPHAGOGASTRODUODENOSCOPY (EGD) WITH PROPOFOL N/A 07/05/2021   Procedure: ESOPHAGOGASTRODUODENOSCOPY (EGD) WITH PROPOFOL;  Surgeon: Harvel Quale, MD;  [facility];  Service: Gastroenterology;  Laterality: N/A;  12:30   HERNIA REPAIR      Current Outpatient Medications  Medication Sig Dispense Refill   acetaminophen (TYLENOL) 500 MG tablet Take 500 mg by mouth every 6 (six) hours as needed (for pain.).     acyclovir (ZOVIRAX) 400 MG tablet Take 400 mg by mouth daily as needed (fever blisters).     busPIRone (BUSPAR) 5 MG tablet TAKE 1 TABLET BY MOUTH THREE TIMES DAILY 90 tablet 3   omeprazole (PRILOSEC) 40 MG capsule Take 1 capsule (40 mg total) by mouth 2 (two) times daily. 120 capsule 0   doxycycline (VIBRAMYCIN) 50 MG capsule Take 50 mg by mouth daily. (Patient not taking: Reported on 08/03/2021)     No current facility-administered medications for this visit.    Allergies as of 08/03/2021   (No Known Allergies)    Family History  Problem Relation Age of Onset   Heart disease Father     Social History   Socioeconomic History   Marital status: Widowed    Spouse name: Not on file   Number of children: Not on file   Years of education: Not on file   Highest education level: Not on file  Occupational History   Not on file  Tobacco Use   Smoking status: Never   Smokeless tobacco: Never  Vaping Use   Vaping Use: Never used  Substance and Sexual Activity   Alcohol use: No   Drug use: No   Sexual activity: Yes    Birth control/protection: Surgical  Other Topics Concern   Not on file  Social History Narrative   Not on file   Social  Determinants of Health   Financial Resource Strain: Not on file  Food Insecurity: Not on file  Transportation Needs: Not on file  Physical Activity: Not on file  Stress: Not on file  Social Connections: Not on file   Review of systems General: negative for malaise, night sweats, fever, chills, weight loss Neck: Negative for lumps, goiter, pain and significant neck swelling Resp: Negative for cough, wheezing, dyspnea at rest CV: Negative for chest pain, leg swelling, palpitations, orthopnea GI: denies melena, hematochezia, nausea, vomiting, diarrhea, constipation, dysphagia, odyonophagia, early satiety or unintentional weight loss. +rare epigastric pain MSK: Negative for joint pain or swelling, back pain, and muscle pain. Derm: Negative for itching or rash Psych: Denies depression, anxiety, memory loss, confusion. No homicidal or suicidal ideation.  Heme: Negative for prolonged bleeding, bruising easily, and swollen nodes. Endocrine: Negative for cold or heat intolerance, polyuria, polydipsia and goiter. Neuro: negative for tremor, gait imbalance, syncope and seizures. The remainder of the review of systems is noncontributory.  Physical Exam:  BP 125/80 (BP Location: Right Arm, Patient Position: Sitting, Cuff Size: Large)   Pulse 69   Temp 98.7 F (37.1 C) (Oral)   Ht 4\' 8"  (1.422 m)   Wt 144 lb 8 oz (65.5 kg)   BMI 32.40 kg/m  General:   Alert and oriented. No distress noted. Pleasant and cooperative.  Head:  Normocephalic and atraumatic. Eyes:  Conjuctiva clear without scleral icterus. Mouth:  Oral mucosa pink and moist. Good dentition. No lesions. Heart: Normal rate and rhythm, s1 and s2 heart sounds present.  Lungs: Clear lung sounds in all lobes. Respirations equal and unlabored. Abdomen:  +BS, soft,  and non-distended. TTP of epigastric region, No rebound or guarding. No HSM or masses noted. Derm: No palmar erythema or jaundice Msk:  Symmetrical without gross deformities.  Normal posture. Extremities:  Without edema. Neurologic:  Alert and  oriented x4 Psych:  Alert and cooperative. Normal mood and affect.  Invalid input(s): 6 MONTHS   ASSESSMENT: Melanie David is a 75 y.o. female presenting today for follow up of functional dyspepsia.  Patient is doing much better since starting Omeprazole 40mg  BID after her EGD 07/05/21. She reports that she is still taking buspar 5mg  TID, she is having very little to no epigastric pain now. She occasionally forgets to take her buspar and does not seem to notice much difference in symptoms when this happens. She would prefer to continue with Buspar for now, as symptoms are so well managed. She is still trying to avoid trigger foods, but has started eating certain things in moderation such as white chocolate, which she has tolerated well. We will continue current medication regimen at this time. At next clinic visit, we will discuss tapering off of Buspar and decreasing dose of Omeprazole if symptoms are still well managed. Patient is agreeable with this plan.  She is also due for repeat surveillance colonoscopy for hx of tubular adenoma which she is amenable to getting scheduled.   No red flag symptoms. Patient denies melena, hematochezia, nausea, vomiting, diarrhea, constipation, dysphagia, odyonophagia, early satiety or weight loss.    PLAN:  Continue Omeprazole 40mg  BID 2. Continue Buspar 5mg  TID 3. Avoid trigger foods 4. Schedule colonoscopy   Follow Up: 1 month  Latasha Buczkowski L. Alver Sorrow, MSN, APRN, AGNP-C Adult-Gerontology Nurse Practitioner [facility]

## 2021-08-04 ENCOUNTER — Encounter (INDEPENDENT_AMBULATORY_CARE_PROVIDER_SITE_OTHER): Payer: Self-pay

## 2021-08-08 DIAGNOSIS — Z20828 Contact with and (suspected) exposure to other viral communicable diseases: Secondary | ICD-10-CM | POA: Diagnosis not present

## 2021-08-25 DIAGNOSIS — C799 Secondary malignant neoplasm of unspecified site: Secondary | ICD-10-CM | POA: Diagnosis not present

## 2021-08-25 DIAGNOSIS — F419 Anxiety disorder, unspecified: Secondary | ICD-10-CM | POA: Diagnosis not present

## 2021-08-25 DIAGNOSIS — K219 Gastro-esophageal reflux disease without esophagitis: Secondary | ICD-10-CM | POA: Diagnosis not present

## 2021-09-04 DIAGNOSIS — Z1231 Encounter for screening mammogram for malignant neoplasm of breast: Secondary | ICD-10-CM | POA: Diagnosis not present

## 2021-09-06 ENCOUNTER — Other Ambulatory Visit: Payer: Self-pay

## 2021-09-06 ENCOUNTER — Encounter (INDEPENDENT_AMBULATORY_CARE_PROVIDER_SITE_OTHER): Payer: Self-pay | Admitting: Gastroenterology

## 2021-09-06 ENCOUNTER — Ambulatory Visit (INDEPENDENT_AMBULATORY_CARE_PROVIDER_SITE_OTHER): Payer: Medicare Other | Admitting: Gastroenterology

## 2021-09-06 VITALS — BP 133/80 | HR 76 | Temp 97.9°F | Ht <= 58 in | Wt 146.6 lb

## 2021-09-06 DIAGNOSIS — K253 Acute gastric ulcer without hemorrhage or perforation: Secondary | ICD-10-CM | POA: Diagnosis not present

## 2021-09-06 DIAGNOSIS — K30 Functional dyspepsia: Secondary | ICD-10-CM | POA: Diagnosis not present

## 2021-09-06 DIAGNOSIS — K259 Gastric ulcer, unspecified as acute or chronic, without hemorrhage or perforation: Secondary | ICD-10-CM | POA: Insufficient documentation

## 2021-09-06 MED ORDER — OMEPRAZOLE 40 MG PO CPDR: 40.0000 mg | DELAYED_RELEASE_CAPSULE | Freq: Two times a day (BID) | ORAL | 3 refills | Status: DC

## 2021-09-06 NOTE — Patient Instructions (Signed)
Melanie David  09/06/2021     @PREFPERIOPPHARMACY @   Your procedure is scheduled on 09/13/2021.   Report to [facility] at  0800 A.M.   Call this number if you have problems the morning of surgery:  [phone]   Remember:  Follow the diet and prep instructions given to you by the office.     Take these medicines the morning of surgery with A SIP OF WATER                                       buspar, omeprazole.     Do not wear jewelry, make-up or nail polish.  Do not wear lotions, powders, or perfumes, or deodorant.  Do not shave 48 hours prior to surgery.  Men may shave face and neck.  Do not bring valuables to the hospital.  [facility] is not responsible for any belongings or valuables.  Contacts, dentures or bridgework may not be worn into surgery.  Leave your suitcase in the car.  After surgery it may be brought to your room.  For patients admitted to the hospital, discharge time will be determined by your treatment team.  Patients discharged the day of surgery will not be allowed to drive home and must have someone with them for 24 hours.    Special instructions:   DO NOT smoke tobacco or vape for 24 hours before your procedure.  Please read over the following fact sheets that you were given. Anesthesia Post-op Instructions and Care and Recovery After Surgery      Colonoscopy, Adult, Care After This sheet gives you information about how to care for yourself after your procedure. Your health care provider may also give you more specific instructions. If you have problems or questions, contact your health care provider. What can I expect after the procedure? After the procedure, it is common to have: A small amount of blood in your stool for 24 hours after the procedure. Some gas. Mild cramping or bloating of your abdomen. Follow these instructions at home: Eating and drinking  Drink enough fluid to keep your urine pale yellow. Follow instructions  from your health care provider about eating or drinking restrictions. Resume your normal diet as instructed by your health care provider. Avoid heavy or fried foods that are hard to digest. Activity Rest as told by your health care provider. Avoid sitting for a long time without moving. Get up to take short walks every 1-2 hours. This is important to improve blood flow and breathing. Ask for help if you feel weak or unsteady. Return to your normal activities as told by your health care provider. Ask your health care provider what activities are safe for you. Managing cramping and bloating  Try walking around when you have cramps or feel bloated. Apply heat to your abdomen as told by your health care provider. Use the heat source that your health care provider recommends, such as a moist heat pack or a heating pad. Place a towel between your skin and the heat source. Leave the heat on for 20-30 minutes. Remove the heat if your skin turns bright red. This is especially important if you are unable to feel pain, heat, or cold. You may have a greater risk of getting burned. General instructions If you were given a sedative during the procedure, it can affect you for several  hours. Do not drive or operate machinery until your health care provider says that it is safe. For the first 24 hours after the procedure: Do not sign important documents. Do not drink alcohol. Do your regular daily activities at a slower pace than normal. Eat soft foods that are easy to digest. Take over-the-counter and prescription medicines only as told by your health care provider. Keep all follow-up visits as told by your health care provider. This is important. Contact a health care provider if: You have blood in your stool 2-3 days after the procedure. Get help right away if you have: More than a small spotting of blood in your stool. Large blood clots in your stool. Swelling of your abdomen. Nausea or vomiting. A  fever. Increasing pain in your abdomen that is not relieved with medicine. Summary After the procedure, it is common to have a small amount of blood in your stool. You may also have mild cramping and bloating of your abdomen. If you were given a sedative during the procedure, it can affect you for several hours. Do not drive or operate machinery until your health care provider says that it is safe. Get help right away if you have a lot of blood in your stool, nausea or vomiting, a fever, or increased pain in your abdomen. This information is not intended to replace advice given to you by your health care provider. Make sure you discuss any questions you have with your health care provider. Document Revised: 06/19/2019 Document Reviewed: 03/09/2019 Elsevier Patient Education  [place] After This sheet gives you information about how to care for yourself after your procedure. Your health care provider may also give you more specific instructions. If you have problems or questions, contact your health care provider. What can I expect after the procedure? After the procedure, it is common to have: Tiredness. Forgetfulness about what happened after the procedure. Impaired judgment for important decisions. Nausea or vomiting. Some difficulty with balance. Follow these instructions at home: For the time period you were told by your health care provider:   Rest as needed. Do not participate in activities where you could fall or become injured. Do not drive or use machinery. Do not drink alcohol. Do not take sleeping pills or medicines that cause drowsiness. Do not make important decisions or sign legal documents. Do not take care of children on your own. Eating and drinking Follow the diet that is recommended by your health care provider. Drink enough fluid to keep your urine pale yellow. If you vomit: Drink water, juice, or soup when you can drink  without vomiting. Make sure you have little or no nausea before eating solid foods. General instructions Have a responsible adult stay with you for the time you are told. It is important to have someone help care for you until you are awake and alert. Take over-the-counter and prescription medicines only as told by your health care provider. If you have sleep apnea, surgery and certain medicines can increase your risk for breathing problems. Follow instructions from your health care provider about wearing your sleep device: Anytime you are sleeping, including during daytime naps. While taking prescription pain medicines, sleeping medicines, or medicines that make you drowsy. Avoid smoking. Keep all follow-up visits as told by your health care provider. This is important. Contact a health care provider if: You keep feeling nauseous or you keep vomiting. You feel light-headed. You are still sleepy or having trouble with  balance after 24 hours. You develop a rash. You have a fever. You have redness or swelling around the IV site. Get help right away if: You have trouble breathing. You have new-onset confusion at home. Summary For several hours after your procedure, you may feel tired. You may also be forgetful and have poor judgment. Have a responsible adult stay with you for the time you are told. It is important to have someone help care for you until you are awake and alert. Rest as told. Do not drive or operate machinery. Do not drink alcohol or take sleeping pills. Get help right away if you have trouble breathing, or if you suddenly become confused. This information is not intended to replace advice given to you by your health care provider. Make sure you discuss any questions you have with your health care provider. Document Revised: 04/28/2020 Document Reviewed: 07/16/2019 Elsevier Patient Education  2022 [employer].

## 2021-09-06 NOTE — Progress Notes (Signed)
[name], M.D. Gastroenterology & Hepatology [facility] [address] [place], [place] [address]  Primary Care Physician: Manon Hilding, MD [place] [address]  I will communicate my assessment and recommendations to the referring MD via EMR.  Problems: Erosive gastropathy Functional dyspepsia  History of Present Illness: Melanie David is a 76 y.o. female with past medical history of left breast cancer, erosive gastropathy, osteoporosis and functional dyspepsia, who presents for follow up of abdominal pain.  The patient was last seen on 08/03/2021. At that time, the patient was continued on omeprazole 40 mg twice a day and BuSpar.  She was scheduled for a colonoscopy for history of colon polyps.  States she has been compliant with omeprazole 40 mg twice a day and Buspar 5 mg TID. She has stopped taking doxycycline for rosacea as advised. Has not fully liberated her diet as she is fearful food with spices or tomatoes may cause symptom recurrence.  She denies having any complaints at the moment states feeling well.  The patient denies having any nausea, vomiting, fever, chills, hematochezia, melena, hematemesis, abdominal distention, abdominal pain, diarrhea, jaundice, pruritus or weight loss.  Last Colonoscopy:08/2015 64mm polyp proximal transverse colon, altered will diverticula noted at sigmoid colon some of which were large, normal rectal mucosa, small hemorrhoids below dentate line, path-tubular adenoma She is scheduled to undergo a repeat colonoscopy on 09/13/2021  Last Endoscopy:07/05/21- 2 cm hiatal hernia. - Erosive gastropathy with no stigmata of recent bleeding. (No h pylori)  - Nodular mucosa in the duodenal bulb due to peptic duodenitis and Brunner gland hyperplasia.  Past Medical History: Past Medical History:  Diagnosis Date   Cancer ([place])    Left breast cancer: Lumpectomy   Osteoporosis     Past  Surgical History: Past Surgical History:  Procedure Laterality Date   ABDOMINAL HYSTERECTOMY     APPENDECTOMY     BIOPSY  01/14/2019   Procedure: BIOPSY;  Surgeon: Rogene Houston, MD;  [facility];  Service: Endoscopy;;   BIOPSY  07/05/2021   Procedure: BIOPSY;  Surgeon: Harvel Quale, MD;  [facility];  Service: Gastroenterology;;   BREAST SURGERY     CATARACT EXTRACTION W/PHACO  09/22/2012   Procedure: CATARACT EXTRACTION PHACO AND INTRAOCULAR LENS PLACEMENT ([place]);  Surgeon: Tonny Branch, MD;  Location: [facility];  Service: Ophthalmology;  Laterality: Right;  CDE=13.95   CATARACT EXTRACTION W/PHACO Left 10/06/2012   Procedure: CATARACT EXTRACTION PHACO AND INTRAOCULAR LENS PLACEMENT ([facility]);  Surgeon: Tonny Branch, MD;  Location: [facility];  Service: Ophthalmology;  Laterality: Left;  CDE: 14.58   CERVICAL DISC SURGERY  2010   CHOLECYSTECTOMY     COLONOSCOPY N/A 09/01/2015   Procedure: COLONOSCOPY;  Surgeon: Rogene Houston, MD;  [facility];  Service: Endoscopy;  Laterality: N/A;  9:30 - moved to 11:00 - Ann to notify pt   ESOPHAGOGASTRODUODENOSCOPY N/A 01/14/2019   Procedure: ESOPHAGOGASTRODUODENOSCOPY (EGD);  Surgeon: Rogene Houston, MD;  [facility];  Service: Endoscopy;  Laterality: N/A;   ESOPHAGOGASTRODUODENOSCOPY (EGD) WITH PROPOFOL N/A 07/05/2021   Procedure: ESOPHAGOGASTRODUODENOSCOPY (EGD) WITH PROPOFOL;  Surgeon: Harvel Quale, MD;  [facility];  Service: Gastroenterology;  Laterality: N/A;  12:30   HERNIA REPAIR      Family History: Family History  Problem Relation Age of Onset   Heart disease Father     Social History: Social History   Tobacco  Use  Smoking Status Never  Smokeless Tobacco Never   Social History   Substance and Sexual Activity  Alcohol Use No   Social History   Substance and Sexual Activity  Drug Use No    Allergies: No Known Allergies  Medications: Current  Outpatient Medications  Medication Sig Dispense Refill   acetaminophen (TYLENOL) 500 MG tablet Take 500 mg by mouth every 6 (six) hours as needed (for pain.).     acyclovir (ZOVIRAX) 400 MG tablet Take 400 mg by mouth daily as needed (fever blisters).     busPIRone (BUSPAR) 5 MG tablet TAKE 1 TABLET BY MOUTH THREE TIMES DAILY 90 tablet 3   doxycycline (MONODOX) 50 MG capsule Take 50 mg by mouth daily as needed (rosacea). (Patient not taking: Reported on 09/06/2021)     omeprazole (PRILOSEC) 40 MG capsule Take 1 capsule (40 mg total) by mouth 2 (two) times daily. 90 capsule 3   No current facility-administered medications for this visit.    Review of Systems: GENERAL: negative for malaise, night sweats HEENT: No changes in hearing or vision, no nose bleeds or other nasal problems. NECK: Negative for lumps, goiter, pain and significant neck swelling RESPIRATORY: Negative for cough, wheezing CARDIOVASCULAR: Negative for chest pain, leg swelling, palpitations, orthopnea GI: SEE HPI MUSCULOSKELETAL: Negative for joint pain or swelling, back pain, and muscle pain. SKIN: Negative for lesions, rash PSYCH: Negative for sleep disturbance, mood disorder and recent psychosocial stressors. HEMATOLOGY Negative for prolonged bleeding, bruising easily, and swollen nodes. ENDOCRINE: Negative for cold or heat intolerance, polyuria, polydipsia and goiter. NEURO: negative for tremor, gait imbalance, syncope and seizures. The remainder of the review of systems is noncontributory.   Physical Exam: BP 133/80 (BP Location: Left Arm, Patient Position: Sitting, Cuff Size: Large)    Pulse 76    Temp 97.9 F (36.6 C) (Oral)    Ht 4\' 8"  (1.422 m)    Wt 146 lb 9.6 oz (66.5 kg)    BMI 32.87 kg/m  GENERAL: The patient is AO x3, in no acute distress. HEENT: Head is normocephalic and atraumatic. EOMI are intact. Mouth is well hydrated and without lesions. NECK: Supple. No masses LUNGS: Clear to auscultation. No  presence of rhonchi/wheezing/rales. Adequate chest expansion HEART: RRR, normal s1 and s2. ABDOMEN: very mildly tender upon palpation of the epigastric area, no guarding, no peritoneal signs, and nondistended. BS +. No masses. EXTREMITIES: Without any cyanosis, clubbing, rash, lesions or edema. NEUROLOGIC: AOx3, no focal motor deficit. SKIN: no jaundice, no rashes  Imaging/Labs: as above  I personally reviewed and interpreted the available labs, imaging and endoscopic files.  Impression and Plan: Melanie David is a 76 y.o. female with past medical history of left breast cancer, erosive gastropathy, osteoporosis and functional dyspepsia, who presents for follow up of abdominal pain.  Was found to have some gastric superficial ulcers during her most recent endoscopy.  She has been taking PPI twice a day with major improvement of her symptoms and has very minimal tenderness upon palpation of the epigastric area.  She had 2 tests for H. pylori that were negative.  Due to this, I discussed the possibility of decreasing her dose of omeprazole to once a day while continuing the BuSpar for possible concomitant functional dyspepsia.  We will repeat an EGD in May 2023 to assess mucosal healing.  Patient understood and agreed.  - Schedule EGD in May 2023 - Proceed with scheduled colonoscopy - Decrease omeprazole to 40 mg every day -  Continue Buspar 5 mg TID  All questions were answered.      Harvel Quale, MD Gastroenterology and Hepatology [facility]

## 2021-09-06 NOTE — Patient Instructions (Signed)
Schedule EGD in May 2023 Proceed with scheduled colonoscopy Decrease omeprazole to 40 mg every day Continue Buspar 5 mg TID

## 2021-09-10 ENCOUNTER — Other Ambulatory Visit (INDEPENDENT_AMBULATORY_CARE_PROVIDER_SITE_OTHER): Payer: Self-pay | Admitting: Gastroenterology

## 2021-09-10 DIAGNOSIS — K30 Functional dyspepsia: Secondary | ICD-10-CM

## 2021-09-11 ENCOUNTER — Other Ambulatory Visit: Payer: Self-pay

## 2021-09-11 ENCOUNTER — Encounter (HOSPITAL_COMMUNITY)
Admission: RE | Admit: 2021-09-11 | Discharge: 2021-09-11 | Disposition: A | Discharge status: Home / Self Care | Payer: Medicare Other | Source: Ambulatory Visit | Attending: Internal Medicine | Admitting: Internal Medicine

## 2021-09-11 ENCOUNTER — Encounter (HOSPITAL_COMMUNITY): Payer: Self-pay

## 2021-09-11 HISTORY — DX: Depression, unspecified: F32.A

## 2021-09-11 HISTORY — DX: Anxiety disorder, unspecified: F41.9

## 2021-09-13 ENCOUNTER — Ambulatory Visit (HOSPITAL_COMMUNITY): Payer: Medicare Other | Admitting: Anesthesiology

## 2021-09-13 ENCOUNTER — Encounter (HOSPITAL_COMMUNITY): Payer: Self-pay | Admitting: Internal Medicine

## 2021-09-13 ENCOUNTER — Ambulatory Visit (HOSPITAL_COMMUNITY)
Admission: RE | Admit: 2021-09-13 | Discharge: 2021-09-13 | Disposition: A | Discharge status: Home / Self Care | Payer: Medicare Other | Attending: Internal Medicine | Admitting: Internal Medicine

## 2021-09-13 ENCOUNTER — Other Ambulatory Visit: Payer: Self-pay

## 2021-09-13 ENCOUNTER — Encounter (HOSPITAL_COMMUNITY)
Admission: RE | Disposition: A | Discharge status: Home / Self Care | Payer: Self-pay | Source: Home / Self Care | Attending: Internal Medicine

## 2021-09-13 DIAGNOSIS — Z860100 Personal history of colon polyps, unspecified: Secondary | ICD-10-CM

## 2021-09-13 DIAGNOSIS — K5730 Diverticulosis of large intestine without perforation or abscess without bleeding: Secondary | ICD-10-CM | POA: Insufficient documentation

## 2021-09-13 DIAGNOSIS — Z09 Encounter for follow-up examination after completed treatment for conditions other than malignant neoplasm: Secondary | ICD-10-CM | POA: Diagnosis not present

## 2021-09-13 DIAGNOSIS — Z800 Family history of malignant neoplasm of digestive organs: Secondary | ICD-10-CM | POA: Diagnosis not present

## 2021-09-13 DIAGNOSIS — F418 Other specified anxiety disorders: Secondary | ICD-10-CM | POA: Diagnosis not present

## 2021-09-13 DIAGNOSIS — K644 Residual hemorrhoidal skin tags: Secondary | ICD-10-CM | POA: Diagnosis not present

## 2021-09-13 DIAGNOSIS — Z1211 Encounter for screening for malignant neoplasm of colon: Secondary | ICD-10-CM | POA: Diagnosis not present

## 2021-09-13 HISTORY — PX: COLONOSCOPY WITH PROPOFOL: SHX5780

## 2021-09-13 LAB — HM COLONOSCOPY

## 2021-09-13 SURGERY — COLONOSCOPY WITH PROPOFOL
Anesthesia: General

## 2021-09-13 MED ORDER — METAMUCIL SMOOTH TEXTURE 58.6 % PO POWD: 1.0000 | Freq: Every day | ORAL | Status: DC

## 2021-09-13 MED ORDER — LIDOCAINE HCL (CARDIAC) PF 100 MG/5ML IV SOSY
PREFILLED_SYRINGE | INTRAVENOUS | Status: DC | PRN
Start: 2021-09-13 — End: 2021-09-13
  Administered 2021-09-13: 50 mg via INTRAVENOUS

## 2021-09-13 MED ORDER — PROPOFOL 10 MG/ML IV BOLUS
INTRAVENOUS | Status: DC | PRN
  Administered 2021-09-13: 100 mg via INTRAVENOUS

## 2021-09-13 MED ORDER — PHENYLEPHRINE 40 MCG/ML (10ML) SYRINGE FOR IV PUSH (FOR BLOOD PRESSURE SUPPORT)
PREFILLED_SYRINGE | INTRAVENOUS | Status: AC
  Filled 2021-09-13: qty 10

## 2021-09-13 MED ORDER — PHENYLEPHRINE 40 MCG/ML (10ML) SYRINGE FOR IV PUSH (FOR BLOOD PRESSURE SUPPORT)
PREFILLED_SYRINGE | INTRAVENOUS | Status: DC | PRN
  Administered 2021-09-13: 80 ug via INTRAVENOUS

## 2021-09-13 MED ORDER — PROPOFOL 500 MG/50ML IV EMUL
INTRAVENOUS | Status: DC | PRN
  Administered 2021-09-13: 100 ug/kg/min via INTRAVENOUS

## 2021-09-13 MED ORDER — LACTATED RINGERS IV SOLN: INTRAVENOUS | Status: DC

## 2021-09-13 NOTE — Transfer of Care (Signed)
Immediate Anesthesia Transfer of Care Note  Patient: Melanie David  Procedure(s) Performed: COLONOSCOPY WITH PROPOFOL  Patient Location: Short Stay  Anesthesia Type:General  Level of Consciousness: awake, alert  and oriented  Airway & Oxygen Therapy: Patient Spontanous Breathing  Post-op Assessment: Report given to RN, Post -op Vital signs reviewed and stable and Patient moving all extremities X 4  Post vital signs: Reviewed and stable  Last Vitals:  Vitals Value Taken Time  BP    Temp    Pulse    Resp    SpO2      Last Pain:  Vitals:   09/13/21 0809  TempSrc: Oral  PainSc: 0-No pain         Complications: No notable events documented.

## 2021-09-13 NOTE — Op Note (Signed)
[facility] Patient Name: Melanie David Procedure Date: 09/13/2021 7:50 AM MRN: [id number] Date of Birth: April 30, 1946 Attending MD: Hildred Laser , MD CSN: [id number] Age: 76 Admit Type: Outpatient Procedure:                Colonoscopy Indications:              High risk colon cancer surveillance: Personal                            history of colonic polyps Providers:                Hildred Laser, MD, Hughie Closs, RN, Lurline Del,                            RN, Randa Spike, Technician Referring MD:             Consuello Masse, MD Medicines:                Propofol per Anesthesia Complications:            No immediate complications. Estimated Blood Loss:     Estimated blood loss: none. Procedure:                Pre-Anesthesia Assessment:                           - Prior to the procedure, a History and Physical                            was performed, and patient medications and                            allergies were reviewed. The patient's tolerance of                            previous anesthesia was also reviewed. The risks                            and benefits of the procedure and the sedation                            options and risks were discussed with the patient.                            All questions were answered, and informed consent                            was obtained. Prior Anticoagulants: The patient has                            taken no previous anticoagulant or antiplatelet                            agents. ASA Grade Assessment: II - A patient with  mild systemic disease. After reviewing the risks                            and benefits, the patient was deemed in                            satisfactory condition to undergo the procedure.                           After obtaining informed consent, the colonoscope                            was passed under direct vision. Throughout the                            procedure,  the patient's blood pressure, pulse, and                            oxygen saturations were monitored continuously. The                            [id number]-HQ190L ([id number]) scope was introduced through                            the anus and advanced to the the terminal ileum,                            with identification of the appendiceal orifice and                            IC valve. The colonoscopy was performed without                            difficulty. The patient tolerated the procedure                            well. The quality of the bowel preparation was                            good. The ileocecal valve, appendiceal orifice, and                            rectum were photographed. Scope In: 8:34:26 AM Scope Out: 8:46:31 AM Scope Withdrawal Time: 0 hours 7 minutes 12 seconds  Total Procedure Duration: 0 hours 12 minutes 5 seconds  Findings:      The perianal and digital rectal examinations were normal.      The terminal ileum appeared normal.      Multiple small and large-mouthed diverticula were found in the sigmoid       colon, descending colon, transverse colon, hepatic flexure and ascending       colon.      The exam was otherwise normal throughout the examined colon.      A scar was found in the rectum. The scar tissue was healthy in  appearance.      External hemorrhoids were found during retroflexion. The hemorrhoids       were small. Impression:               - The examined portion of the ileum was normal.                           - Diverticulosis in the sigmoid colon, in the                            descending colon, in the transverse colon, at the                            hepatic flexure and in the ascending colon(multiple                            diverticula at sigmoid colon with a few more at                            ascending, hepatic flexure, transverse and                            descending colon)                           - Scar in the  rectum.                           - External hemorrhoids.                           - No specimens collected. Moderate Sedation:      Per Anesthesia Care Recommendation:           - Patient has a contact number available for                            emergencies. The signs and symptoms of potential                            delayed complications were discussed with the                            patient. Return to normal activities tomorrow.                            Written discharge instructions were provided to the                            patient.                           - High fiber diet today.                           - Continue present medications.                           -  Use original regular Metamucil one tablespoon PO                            daily.                           - No repeat colonoscopy due to age and the absence                            of advanced adenomas. Procedure Code(s):        --- Professional ---                           [phone], Colonoscopy, flexible; diagnostic, including                            collection of specimen(s) by brushing or washing,                            when performed (separate procedure) Diagnosis Code(s):        --- Professional ---                           Z86.010, Personal history of colonic polyps                           K64.4, Residual hemorrhoidal skin tags                           K62.89, Other specified diseases of anus and rectum                           K57.30, Diverticulosis of large intestine without                            perforation or abscess without bleeding CPT copyright 2019 American Medical Association. All rights reserved. The codes documented in this report are preliminary and upon coder review may  be revised to meet current compliance requirements. Hildred Laser, MD Hildred Laser, MD 09/13/2021 8:58:59 AM This report has been signed electronically. Number of Addenda: 0

## 2021-09-13 NOTE — H&P (Signed)
Melanie David is an 76 y.o. female.   Chief Complaint: Patient is here for colonoscopy HPI: Patient is 76 year old Caucasian female who is here for surveillance colonoscopy.  She has a history of tubular adenoma.  Last exam was in January 2017.  Family history significant for colon carcinoma in paternal grandfather who was in his 28s at the time of diagnosis and her brother died of metastatic colon carcinoma in his early 38s.  He had liver mets died within few months of diagnosis. Patient denies abdominal pain change in bowel habits or rectal bleeding.  She does not take aspirin.  Her appetite is good and her weight is stable. She says epigastrium is not sore anymore.  She was seen by Dr. Jenetta Downer underwent EGD in November 2022.  Past Medical History:  Diagnosis Date   Anxiety    Cancer ([place])    Left breast cancer: Lumpectomy   Depression    Osteoporosis     Past Surgical History:  Procedure Laterality Date   ABDOMINAL HYSTERECTOMY     APPENDECTOMY     BIOPSY  01/14/2019   Procedure: BIOPSY;  Surgeon: Rogene Houston, MD;  [facility];  Service: Endoscopy;;   BIOPSY  07/05/2021   Procedure: BIOPSY;  Surgeon: Harvel Quale, MD;  [facility];  Service: Gastroenterology;;   BREAST SURGERY     CATARACT EXTRACTION W/PHACO  09/22/2012   Procedure: CATARACT EXTRACTION PHACO AND INTRAOCULAR LENS PLACEMENT ([place]);  Surgeon: Tonny Branch, MD;  Location: [facility];  Service: Ophthalmology;  Laterality: Right;  CDE=13.95   CATARACT EXTRACTION W/PHACO Left 10/06/2012   Procedure: CATARACT EXTRACTION PHACO AND INTRAOCULAR LENS PLACEMENT ([facility]);  Surgeon: Tonny Branch, MD;  Location: [facility];  Service: Ophthalmology;  Laterality: Left;  CDE: 14.58   CERVICAL DISC SURGERY  2010   CHOLECYSTECTOMY     COLONOSCOPY N/A 09/01/2015   Procedure: COLONOSCOPY;  Surgeon: Rogene Houston, MD;  [facility];  Service: Endoscopy;  Laterality: N/A;  9:30 - moved to 11:00 - Ann to  notify pt   ESOPHAGOGASTRODUODENOSCOPY N/A 01/14/2019   Procedure: ESOPHAGOGASTRODUODENOSCOPY (EGD);  Surgeon: Rogene Houston, MD;  [facility];  Service: Endoscopy;  Laterality: N/A;   ESOPHAGOGASTRODUODENOSCOPY (EGD) WITH PROPOFOL N/A 07/05/2021   Procedure: ESOPHAGOGASTRODUODENOSCOPY (EGD) WITH PROPOFOL;  Surgeon: Harvel Quale, MD;  [facility];  Service: Gastroenterology;  Laterality: N/A;  12:30   HERNIA REPAIR      Family History  Problem Relation Age of Onset   Heart disease Father    Social History:  reports that she has never smoked. She has never used smokeless tobacco. She reports that she does not drink alcohol and does not use drugs.  Allergies: No Known Allergies  Medications Prior to Admission  Medication Sig Dispense Refill   acetaminophen (TYLENOL) 500 MG tablet Take 500 mg by mouth every 6 (six) hours as needed (for pain.).     acyclovir (ZOVIRAX) 400 MG tablet Take 400 mg by mouth daily as needed (fever blisters).     busPIRone (BUSPAR) 5 MG tablet TAKE 1 TABLET BY MOUTH THREE TIMES DAILY 90 tablet 3   doxycycline (MONODOX) 50 MG capsule Take 50 mg by mouth daily as needed (rosacea).     omeprazole (PRILOSEC) 40 MG capsule Take 1 capsule (40 mg total) by mouth 2 (two) times daily. 90 capsule 3    No results found for this or any previous visit (from the past  48 hour(s)). No results found.  Review of Systems  Blood pressure 134/60, pulse 92, temperature 98.3 F (36.8 C), temperature source Oral, resp. rate 12, SpO2 100 %. Physical Exam HENT:     Mouth/Throat:     Mouth: Mucous membranes are moist.     Pharynx: Oropharynx is clear.  Eyes:     General: No scleral icterus.    Conjunctiva/sclera: Conjunctivae normal.  Cardiovascular:     Rate and Rhythm: Normal rate and regular rhythm.     Heart sounds: Normal heart sounds. No murmur heard. Pulmonary:     Effort: Pulmonary effort is normal.     Breath sounds: Normal  breath sounds.  Abdominal:     Comments: Abdomen is symmetrical.  It is soft on palpation with mild midepigastric tenderness.  No organomegaly or masses.  Abdomen is  Musculoskeletal:        General: No swelling.     Cervical back: Neck supple.  Lymphadenopathy:     Cervical: No cervical adenopathy.  Skin:    General: Skin is warm and dry.  Neurological:     Mental Status: She is alert.     Assessment/Plan  History of tubular adenoma. Family history of colorectal carcinoma in her first and a second-degree relative Surveillance colonoscopy.  Hildred Laser, MD 09/13/2021, 8:24 AM

## 2021-09-13 NOTE — Discharge Instructions (Signed)
Resume usual medications as before Metamucil 1 tablespoonful or 1 packet by mouth daily(3 to 4 g). High-fiber diet No driving for 24 hours. No recommendation for colonoscopy at this time.

## 2021-09-13 NOTE — Anesthesia Procedure Notes (Signed)
Date/Time: 09/13/2021 8:36 AM Performed by: Orlie Dakin, CRNA Pre-anesthesia Checklist: Patient identified, Emergency Drugs available, Suction available and Patient being monitored Patient Re-evaluated:Patient Re-evaluated prior to induction Oxygen Delivery Method: Nasal cannula Induction Type: IV induction Placement Confirmation: positive ETCO2

## 2021-09-13 NOTE — Anesthesia Postprocedure Evaluation (Signed)
Anesthesia Post Note  Patient: Melanie David  Procedure(s) Performed: COLONOSCOPY WITH PROPOFOL  Patient location during evaluation: Endoscopy Anesthesia Type: General Level of consciousness: awake and alert Pain management: pain level controlled Vital Signs Assessment: post-procedure vital signs reviewed and stable Respiratory status: spontaneous breathing, nonlabored ventilation, respiratory function stable and patient connected to nasal cannula oxygen Cardiovascular status: blood pressure returned to baseline and stable Postop Assessment: no apparent nausea or vomiting Anesthetic complications: no   No notable events documented.   Last Vitals:  Vitals:   09/13/21 0851 09/13/21 0856  BP:  121/62  Pulse: 88   Resp: 14   Temp: 36.8 C   SpO2: 100%     Last Pain:  Vitals:   09/13/21 0851  TempSrc: Oral  PainSc: 0-No pain                 Trixie Rude

## 2021-09-13 NOTE — Anesthesia Preprocedure Evaluation (Signed)
Anesthesia Evaluation  Patient identified by MRN, date of birth, ID band Patient awake    Reviewed: Allergy & Precautions, NPO status , Patient's Chart, lab work & pertinent test results  Airway Mallampati: II  TM Distance: >3 FB Neck ROM: Full    Dental  (+) Upper Dentures, Lower Dentures   Pulmonary neg pulmonary ROS,    Pulmonary exam normal breath sounds clear to auscultation       Cardiovascular negative cardio ROS Normal cardiovascular exam Rhythm:Regular Rate:Normal     Neuro/Psych PSYCHIATRIC DISORDERS Anxiety Depression negative neurological ROS     GI/Hepatic negative GI ROS, Neg liver ROS, Medicated,  Endo/Other  negative endocrine ROS  Renal/GU negative Renal ROS  negative genitourinary   Musculoskeletal negative musculoskeletal ROS (+)   Abdominal   Peds negative pediatric ROS (+)  Hematology negative hematology ROS (+)   Anesthesia Other Findings Let breast cancer  Reproductive/Obstetrics negative OB ROS                             Anesthesia Physical  Anesthesia Plan  ASA: 2  Anesthesia Plan: General   Post-op Pain Management:    Induction: Intravenous  PONV Risk Score and Plan: TIVA  Airway Management Planned: Nasal Cannula and Natural Airway  Additional Equipment:   Intra-op Plan:   Post-operative Plan:   Informed Consent: I have reviewed the patients History and Physical, chart, labs and discussed the procedure including the risks, benefits and alternatives for the proposed anesthesia with the patient or authorized representative who has indicated his/her understanding and acceptance.       Plan Discussed with: CRNA  Anesthesia Plan Comments:         Anesthesia Quick Evaluation

## 2021-09-14 ENCOUNTER — Encounter (INDEPENDENT_AMBULATORY_CARE_PROVIDER_SITE_OTHER): Payer: Self-pay | Admitting: *Deleted

## 2021-09-15 ENCOUNTER — Encounter (HOSPITAL_COMMUNITY): Payer: Self-pay | Admitting: Internal Medicine

## 2021-09-22 ENCOUNTER — Other Ambulatory Visit (INDEPENDENT_AMBULATORY_CARE_PROVIDER_SITE_OTHER): Payer: Self-pay

## 2021-09-22 DIAGNOSIS — K30 Functional dyspepsia: Secondary | ICD-10-CM

## 2021-09-22 MED ORDER — BUSPIRONE HCL 5 MG PO TABS: 5.0000 mg | ORAL_TABLET | Freq: Three times a day (TID) | ORAL | 3 refills | Status: DC

## 2021-09-26 DIAGNOSIS — C799 Secondary malignant neoplasm of unspecified site: Secondary | ICD-10-CM | POA: Diagnosis not present

## 2021-09-26 DIAGNOSIS — K219 Gastro-esophageal reflux disease without esophagitis: Secondary | ICD-10-CM | POA: Diagnosis not present

## 2021-09-26 DIAGNOSIS — F419 Anxiety disorder, unspecified: Secondary | ICD-10-CM | POA: Diagnosis not present

## 2021-10-23 DIAGNOSIS — Z20822 Contact with and (suspected) exposure to covid-19: Secondary | ICD-10-CM | POA: Diagnosis not present

## 2021-10-25 DIAGNOSIS — Z6832 Body mass index (BMI) 32.0-32.9, adult: Secondary | ICD-10-CM | POA: Diagnosis not present

## 2021-10-25 DIAGNOSIS — R1012 Left upper quadrant pain: Secondary | ICD-10-CM | POA: Diagnosis not present

## 2021-11-01 DIAGNOSIS — K2100 Gastro-esophageal reflux disease with esophagitis, without bleeding: Secondary | ICD-10-CM | POA: Diagnosis not present

## 2021-11-01 DIAGNOSIS — Z853 Personal history of malignant neoplasm of breast: Secondary | ICD-10-CM | POA: Diagnosis not present

## 2021-11-01 DIAGNOSIS — R1012 Left upper quadrant pain: Secondary | ICD-10-CM | POA: Diagnosis not present

## 2021-11-02 DIAGNOSIS — Z20828 Contact with and (suspected) exposure to other viral communicable diseases: Secondary | ICD-10-CM | POA: Diagnosis not present

## 2021-11-26 DIAGNOSIS — Z20822 Contact with and (suspected) exposure to covid-19: Secondary | ICD-10-CM | POA: Diagnosis not present

## 2021-11-27 DIAGNOSIS — Z6832 Body mass index (BMI) 32.0-32.9, adult: Secondary | ICD-10-CM | POA: Diagnosis not present

## 2021-11-27 DIAGNOSIS — Z853 Personal history of malignant neoplasm of breast: Secondary | ICD-10-CM | POA: Diagnosis not present

## 2021-11-27 DIAGNOSIS — E7849 Other hyperlipidemia: Secondary | ICD-10-CM | POA: Diagnosis not present

## 2021-11-27 DIAGNOSIS — K2100 Gastro-esophageal reflux disease with esophagitis, without bleeding: Secondary | ICD-10-CM | POA: Diagnosis not present

## 2021-11-29 ENCOUNTER — Other Ambulatory Visit (INDEPENDENT_AMBULATORY_CARE_PROVIDER_SITE_OTHER): Payer: Self-pay

## 2021-11-29 DIAGNOSIS — K253 Acute gastric ulcer without hemorrhage or perforation: Secondary | ICD-10-CM

## 2021-11-30 ENCOUNTER — Encounter (INDEPENDENT_AMBULATORY_CARE_PROVIDER_SITE_OTHER): Payer: Self-pay

## 2021-11-30 DIAGNOSIS — Z20822 Contact with and (suspected) exposure to covid-19: Secondary | ICD-10-CM | POA: Diagnosis not present

## 2021-12-19 DIAGNOSIS — Z20822 Contact with and (suspected) exposure to covid-19: Secondary | ICD-10-CM | POA: Diagnosis not present

## 2021-12-21 ENCOUNTER — Encounter (HOSPITAL_COMMUNITY)
Admission: RE | Admit: 2021-12-21 | Discharge: 2021-12-21 | Disposition: A | Discharge status: Home / Self Care | Payer: Medicare Other | Source: Ambulatory Visit | Attending: Gastroenterology | Admitting: Gastroenterology

## 2021-12-22 ENCOUNTER — Encounter (HOSPITAL_COMMUNITY): Payer: Self-pay

## 2021-12-22 ENCOUNTER — Other Ambulatory Visit: Payer: Self-pay

## 2021-12-25 DIAGNOSIS — L719 Rosacea, unspecified: Secondary | ICD-10-CM | POA: Diagnosis not present

## 2021-12-26 ENCOUNTER — Encounter (HOSPITAL_COMMUNITY)
Admission: RE | Disposition: A | Discharge status: Home / Self Care | Payer: Self-pay | Source: Home / Self Care | Attending: Gastroenterology

## 2021-12-26 ENCOUNTER — Ambulatory Visit (HOSPITAL_BASED_OUTPATIENT_CLINIC_OR_DEPARTMENT_OTHER): Payer: Medicare Other | Admitting: Anesthesiology

## 2021-12-26 ENCOUNTER — Ambulatory Visit (HOSPITAL_COMMUNITY): Payer: Medicare Other | Admitting: Anesthesiology

## 2021-12-26 ENCOUNTER — Ambulatory Visit (HOSPITAL_COMMUNITY)
Admission: RE | Admit: 2021-12-26 | Discharge: 2021-12-26 | Disposition: A | Discharge status: Home / Self Care | Payer: Medicare Other | Attending: Gastroenterology | Admitting: Gastroenterology

## 2021-12-26 ENCOUNTER — Encounter (HOSPITAL_COMMUNITY): Payer: Self-pay | Admitting: Gastroenterology

## 2021-12-26 DIAGNOSIS — K30 Functional dyspepsia: Secondary | ICD-10-CM

## 2021-12-26 DIAGNOSIS — K449 Diaphragmatic hernia without obstruction or gangrene: Secondary | ICD-10-CM

## 2021-12-26 DIAGNOSIS — F32A Depression, unspecified: Secondary | ICD-10-CM | POA: Insufficient documentation

## 2021-12-26 DIAGNOSIS — F418 Other specified anxiety disorders: Secondary | ICD-10-CM | POA: Diagnosis not present

## 2021-12-26 DIAGNOSIS — K222 Esophageal obstruction: Secondary | ICD-10-CM | POA: Diagnosis not present

## 2021-12-26 DIAGNOSIS — K259 Gastric ulcer, unspecified as acute or chronic, without hemorrhage or perforation: Secondary | ICD-10-CM

## 2021-12-26 DIAGNOSIS — K253 Acute gastric ulcer without hemorrhage or perforation: Secondary | ICD-10-CM | POA: Insufficient documentation

## 2021-12-26 DIAGNOSIS — F419 Anxiety disorder, unspecified: Secondary | ICD-10-CM | POA: Insufficient documentation

## 2021-12-26 DIAGNOSIS — Z79899 Other long term (current) drug therapy: Secondary | ICD-10-CM | POA: Diagnosis not present

## 2021-12-26 HISTORY — PX: ESOPHAGOGASTRODUODENOSCOPY (EGD) WITH PROPOFOL: SHX5813

## 2021-12-26 SURGERY — ESOPHAGOGASTRODUODENOSCOPY (EGD) WITH PROPOFOL
Anesthesia: General

## 2021-12-26 MED ORDER — PROPOFOL 500 MG/50ML IV EMUL
INTRAVENOUS | Status: DC | PRN
  Administered 2021-12-26: 150 ug/kg/min via INTRAVENOUS

## 2021-12-26 MED ORDER — LIDOCAINE HCL (CARDIAC) PF 100 MG/5ML IV SOSY
PREFILLED_SYRINGE | INTRAVENOUS | Status: DC | PRN
  Administered 2021-12-26: 50 mg via INTRAVENOUS

## 2021-12-26 MED ORDER — LACTATED RINGERS IV SOLN
INTRAVENOUS | Status: DC
  Administered 2021-12-26: 1000 mL via INTRAVENOUS

## 2021-12-26 MED ORDER — PROPOFOL 10 MG/ML IV BOLUS
INTRAVENOUS | Status: DC | PRN
  Administered 2021-12-26: 100 mg via INTRAVENOUS

## 2021-12-26 MED ORDER — OMEPRAZOLE 40 MG PO CPDR: 40.0000 mg | DELAYED_RELEASE_CAPSULE | Freq: Every day | ORAL | 3 refills | Status: AC

## 2021-12-26 NOTE — Anesthesia Procedure Notes (Signed)
Date/Time: 12/26/2021 11:47 AM ?Performed by: Orlie Dakin, CRNA ?Pre-anesthesia Checklist: Patient identified, Emergency Drugs available, Suction available and Patient being monitored ?Patient Re-evaluated:Patient Re-evaluated prior to induction ?Oxygen Delivery Method: Nasal cannula ?Induction Type: IV induction ?Placement Confirmation: positive ETCO2 ? ? ? ? ?

## 2021-12-26 NOTE — Anesthesia Postprocedure Evaluation (Signed)
Anesthesia Post Note ? ?Patient: Melanie David ? ?Procedure(s) Performed: ESOPHAGOGASTRODUODENOSCOPY (EGD) WITH PROPOFOL ? ?Patient location during evaluation: Phase II ?Anesthesia Type: General ?Level of consciousness: awake ?Pain management: pain level controlled ?Vital Signs Assessment: post-procedure vital signs reviewed and stable ?Respiratory status: spontaneous breathing and respiratory function stable ?Cardiovascular status: blood pressure returned to baseline and stable ?Postop Assessment: no headache and no apparent nausea or vomiting ?Anesthetic complications: no ?Comments: Late entry ? ? ?No notable events documented. ? ? ?Last Vitals:  ?Vitals:  ? 12/26/21 1116 12/26/21 1156  ?BP: 137/69 117/81  ?Pulse:  66  ?Resp: 17 17  ?Temp: (!) 36.4 ?C (!) 36.4 ?C  ?SpO2: 98% 96%  ?  ?Last Pain:  ?Vitals:  ? 12/26/21 1156  ?TempSrc: Oral  ?PainSc: 0-No pain  ? ? ?  ?  ?  ?  ?  ?  ? ?Louann Sjogren ? ? ? ? ?

## 2021-12-26 NOTE — Op Note (Signed)
[facility] ?Patient Name: Melanie David ?Procedure Date: 12/26/2021 11:27 AM ?MRN: [id number] ?Date of Birth: [birth year] ?Attending MD: [name] ,  ?CSN: [id number] ?Age: 76 ?Admit Type: Outpatient ?Procedure:                Upper GI endoscopy ?Indications:              Follow-up of acute gastric ulcer ?Providers:                [name], Rosina Lowenstein, RN, Aram Candela ?Referring MD:              ?Medicines:                Monitored Anesthesia Care ?Complications:            No immediate complications. ?Estimated Blood Loss:     Estimated blood loss: none. ?Procedure:                Pre-Anesthesia Assessment: ?                          - Prior to the procedure, a History and Physical  ?                          was performed, and patient medications, allergies  ?                          and sensitivities were reviewed. The patient's  ?                          tolerance of previous anesthesia was reviewed. ?                          - The risks and benefits of the procedure and the  ?                          sedation options and risks were discussed with the  ?                          patient. All questions were answered and informed  ?                          consent was obtained. ?                          - ASA Grade Assessment: II - A patient with mild  ?                          systemic disease. ?                          After obtaining informed consent, the endoscope was  ?                          passed under direct vision. Throughout the  ?                          procedure, the patient's  blood pressure, pulse, and  ?                          oxygen saturations were monitored continuously. The  ?                          GIF-H190 ([id number]) scope was introduced through the  ?                          mouth, and advanced to the second part of duodenum.  ?                          The upper GI endoscopy was accomplished without  ?                          difficulty. The patient  tolerated the procedure  ?                          well. ?Scope In: 11:48:44 AM ?Scope Out: 11:50:54 AM ?Total Procedure Duration: 0 hours 2 minutes 10 seconds  ?Findings: ?     A 2 cm hiatal hernia with a few Cameron erosions was found. ?     A non-obstructing Schatzki ring was found at the gastroesophageal  ?     junction. ?     The entire examined stomach was normal. ?     The examined duodenum was normal. ?Impression:               - 2 cm hiatal hernia with a few Cameron ulcers. ?                          - Non-obstructing Schatzki ring. ?                          - Normal stomach. ?                          - Normal examined duodenum. ?                          - No specimens collected. ?Moderate Sedation: ?     Per Anesthesia Care ?Recommendation:           - Discharge patient to home (ambulatory). ?                          - Resume previous diet. ?                          - Decrease Prilosec (omeprazole) 40 mg PO daily. ?Procedure Code(s):        --- Professional --- ?                          [phone], Esophagogastroduodenoscopy, flexible,  ?                          transoral; diagnostic, including collection of  ?  specimen(s) by brushing or washing, when performed  ?                          (separate procedure) ?Diagnosis Code(s):        --- Professional --- ?                          K44.9, Diaphragmatic hernia without obstruction or  ?                          gangrene ?                          K25.9, Gastric ulcer, unspecified as acute or  ?                          chronic, without hemorrhage or perforation ?                          K25.3, Acute gastric ulcer without hemorrhage or  ?                          perforation ?CPT copyright 2019 [employer]. All rights reserved. ?The codes documented in this report are preliminary and upon coder review may  ?be revised to meet current compliance requirements. ?[name], MD ?[name],  ?12/26/2021  12:01:29 PM ?This report has been signed electronically. ?Number of Addenda: 0 ?

## 2021-12-26 NOTE — H&P (Signed)
Melanie David is an 76 y.o. female.   ?Chief Complaint: follow up gastric ulcers ?HPI: Melanie David is a 76 y.o. female with past medical history of left breast cancer, erosive gastropathy, osteoporosis and functional dyspepsia, who presents for follow up of gastric ulcers. ? ?Patient reports her abdominal burning pain has improved after she had her most recent EGD where she was found to have gastric ulcers.  She has been taking omeprazole 40 mg twice a day since then and her abdominal pain has resolved.  She is also taking BuSpar for functional dyspepsia.  Has presented some intermittent episodes of abdominal pain in the upper abdomen which is different in nature from her previous pain as it is elicited with movement and it feels like she is straining. ? ?The patient denies having any nausea, vomiting, fever, chills, hematochezia, melena, hematemesis, abdominal distention,  diarrhea, jaundice, pruritus or weight loss. ? ?Past Medical History:  ?Diagnosis Date  ? Anxiety   ? Cancer [facility])   ? Left breast cancer: Lumpectomy  ? Depression   ? Osteoporosis   ? ? ?Past Surgical History:  ?Procedure Laterality Date  ? ABDOMINAL HYSTERECTOMY    ? APPENDECTOMY    ? BIOPSY  01/14/2019  ? Procedure: BIOPSY;  Surgeon: Rogene Houston, MD;  Location: [facility];  Service: Endoscopy;;  ? BIOPSY  07/05/2021  ? Procedure: BIOPSY;  Surgeon: Harvel Quale, MD;  Location: [facility];  Service: Gastroenterology;;  ? BREAST SURGERY    ? CATARACT EXTRACTION W/PHACO  09/22/2012  ? Procedure: CATARACT EXTRACTION PHACO AND INTRAOCULAR LENS PLACEMENT (IOC);  Surgeon: Tonny Branch, MD;  Location: [facility];  Service: Ophthalmology;  Laterality: Right;  CDE=13.95  ? CATARACT EXTRACTION W/PHACO Left 10/06/2012  ? Procedure: CATARACT EXTRACTION PHACO AND INTRAOCULAR LENS PLACEMENT (IOC);  Surgeon: Tonny Branch, MD;  Location: [facility];  Service: Ophthalmology;  Laterality: Left;  CDE: 14.58  ? [place] SURGERY  2010  ? CHOLECYSTECTOMY     ? COLONOSCOPY N/A 09/01/2015  ? Procedure: COLONOSCOPY;  Surgeon: Rogene Houston, MD;  [facility]: [facility];  Service: Endoscopy;  Laterality: N/A;  9:30 - moved to 11:00 Lelon Frohlich to notify pt  ? COLONOSCOPY WITH PROPOFOL N/A 09/13/2021  ? Procedure: COLONOSCOPY WITH PROPOFOL;  Surgeon: Rogene Houston, MD;  [facility]: [facility];  Service: Endoscopy;  Laterality: N/A;  9:30  ? ESOPHAGOGASTRODUODENOSCOPY N/A 01/14/2019  ? Procedure: ESOPHAGOGASTRODUODENOSCOPY (EGD);  Surgeon: Rogene Houston, MD;  [facility]: [facility];  Service: Endoscopy;  Laterality: N/A;  ? ESOPHAGOGASTRODUODENOSCOPY (EGD) WITH PROPOFOL N/A 07/05/2021  ? Procedure: ESOPHAGOGASTRODUODENOSCOPY (EGD) WITH PROPOFOL;  Surgeon: Harvel Quale, MD;  [facility]: [facility];  Service: Gastroenterology;  Laterality: N/A;  12:30  ? HERNIA REPAIR    ? ? ?Family History  ?Problem Relation Age of Onset  ? Heart disease Father   ? ?Social History:  reports that she has never smoked. She has never used smokeless tobacco. She reports that she does not drink alcohol and does not use drugs. ? ?Allergies: No Known Allergies ? ?Medications Prior to Admission  ?Medication Sig Dispense Refill  ? acetaminophen (TYLENOL) 500 MG tablet Take 500 mg by mouth every 6 (six) hours as needed (for pain.).    ? acyclovir (ZOVIRAX) 400 MG tablet Take 400 mg by mouth daily as needed (fever blisters).    ? busPIRone (BUSPAR) 5 MG tablet Take 1 tablet (5 mg total) by mouth 3 (three) times  daily. 90 tablet 3  ? omeprazole (PRILOSEC) 40 MG capsule Take 1 capsule (40 mg total) by mouth 2 (two) times daily. 90 capsule 3  ? ? ?No results found for this or any previous visit (from the past 48 hour(s)). ?No results found. ? ?Review of Systems ? ?Blood pressure 137/69, temperature (!) 97.5 ?F (36.4 ?C), temperature source Oral, resp. rate 17, SpO2 98 %. ?Physical Exam  ?GENERAL: The patient is AO x3, in no acute distress. ?HEENT: Head is normocephalic and atraumatic. EOMI  are intact. Mouth is well hydrated and without lesions. ?NECK: Supple. No masses ?LUNGS: Clear to auscultation. No presence of rhonchi/wheezing/rales. Adequate chest expansion ?HEART: RRR, normal s1 and s2. ?ABDOMEN: Soft, nontender, no guarding, no peritoneal signs, and nondistended. BS +. No masses. ?EXTREMITIES: Without any cyanosis, clubbing, rash, lesions or edema. ?NEUROLOGIC: AOx3, no focal motor deficit. ?SKIN: no jaundice, no rashes ? ?Assessment/Plan ?Melanie David is a 76 y.o. female with past medical history of left breast cancer, erosive gastropathy, osteoporosis and functional dyspepsia, who presents for follow up of gastric ulcers.  We will proceed with EGD. ? ?Harvel Quale, MD ?12/26/2021, 11:39 AM ? ? ? ?

## 2021-12-26 NOTE — Transfer of Care (Signed)
Immediate Anesthesia Transfer of Care Note ? ?Patient: Melanie David ? ?Procedure(s) Performed: ESOPHAGOGASTRODUODENOSCOPY (EGD) WITH PROPOFOL ? ?Patient Location: Short Stay ? ?Anesthesia Type:General ? ?Level of Consciousness: drowsy ? ?Airway & Oxygen Therapy: Patient Spontanous Breathing ? ?Post-op Assessment: Report given to RN and Post -op Vital signs reviewed and stable ? ?Post vital signs: Reviewed and stable ? ?Last Vitals:  ?Vitals Value Taken Time  ?BP    ?Temp    ?Pulse    ?Resp    ?SpO2    ? ? ?Last Pain:  ?Vitals:  ? 12/26/21 1143  ?TempSrc:   ?PainSc: 5   ?   ? ?Patients Stated Pain Goal: 8 (12/26/21 1116) ? ?Complications: No notable events documented. ?

## 2021-12-26 NOTE — Discharge Instructions (Signed)
You are being discharged to home.  ?Resume your previous diet.  ?Take Prilosec (omeprazole) 40 mg by mouth once a day.  ?

## 2021-12-26 NOTE — Anesthesia Preprocedure Evaluation (Signed)
Anesthesia Evaluation  ?Patient identified by MRN, date of birth, ID band ?Patient awake ? ? ? ?Reviewed: ?Allergy & Precautions, H&P , NPO status , Patient's Chart, lab work & pertinent test results, reviewed documented beta blocker date and time  ? ?Airway ?Mallampati: II ? ?TM Distance: >3 FB ?Neck ROM: full ? ? ? Dental ?no notable dental hx. ? ?  ?Pulmonary ?neg pulmonary ROS,  ?  ?Pulmonary exam normal ?breath sounds clear to auscultation ? ? ? ? ? ? Cardiovascular ?Exercise Tolerance: Good ?negative cardio ROS ? ? ?Rhythm:regular Rate:Normal ? ? ?  ?Neuro/Psych ?PSYCHIATRIC DISORDERS Anxiety Depression negative neurological ROS ?   ? GI/Hepatic ?Neg liver ROS, PUD,   ?Endo/Other  ?negative endocrine ROS ? Renal/GU ?negative Renal ROS  ?negative genitourinary ?  ?Musculoskeletal ? ? Abdominal ?  ?Peds ? Hematology ?negative hematology ROS ?(+)   ?Anesthesia Other Findings ? ? Reproductive/Obstetrics ?negative OB ROS ? ?  ? ? ? ? ? ? ? ? ? ? ? ? ? ?  ?  ? ? ? ? ? ? ? ? ?Anesthesia Physical ?Anesthesia Plan ? ?ASA: 2 ? ?Anesthesia Plan: General  ? ?Post-op Pain Management:   ? ?Induction:  ? ?PONV Risk Score and Plan: Propofol infusion ? ?Airway Management Planned:  ? ?Additional Equipment:  ? ?Intra-op Plan:  ? ?Post-operative Plan:  ? ?Informed Consent: I have reviewed the patients History and Physical, chart, labs and discussed the procedure including the risks, benefits and alternatives for the proposed anesthesia with the patient or authorized representative who has indicated his/her understanding and acceptance.  ? ? ? ?Dental Advisory Given ? ?Plan Discussed with: CRNA ? ?Anesthesia Plan Comments:   ? ? ? ? ? ? ?Anesthesia Quick Evaluation ? ?

## 2022-01-03 ENCOUNTER — Encounter (HOSPITAL_COMMUNITY): Payer: Self-pay | Admitting: Gastroenterology

## 2022-02-07 DIAGNOSIS — B3783 Candidal cheilitis: Secondary | ICD-10-CM | POA: Diagnosis not present

## 2022-02-07 DIAGNOSIS — L0100 Impetigo, unspecified: Secondary | ICD-10-CM | POA: Diagnosis not present

## 2022-03-07 ENCOUNTER — Telehealth: Payer: Self-pay | Admitting: *Deleted

## 2022-03-07 ENCOUNTER — Other Ambulatory Visit: Payer: Self-pay | Admitting: *Deleted

## 2022-03-07 DIAGNOSIS — K30 Functional dyspepsia: Secondary | ICD-10-CM

## 2022-03-07 MED ORDER — BUSPIRONE HCL 5 MG PO TABS: 5.0000 mg | ORAL_TABLET | Freq: Three times a day (TID) | ORAL | 3 refills | Status: AC

## 2022-03-13 NOTE — Telephone Encounter (Signed)
error 

## 2022-03-26 DIAGNOSIS — K219 Gastro-esophageal reflux disease without esophagitis: Secondary | ICD-10-CM | POA: Diagnosis not present

## 2022-03-26 DIAGNOSIS — E7849 Other hyperlipidemia: Secondary | ICD-10-CM | POA: Diagnosis not present

## 2022-04-10 DIAGNOSIS — Z6831 Body mass index (BMI) 31.0-31.9, adult: Secondary | ICD-10-CM | POA: Diagnosis not present

## 2022-04-10 DIAGNOSIS — R0981 Nasal congestion: Secondary | ICD-10-CM | POA: Diagnosis not present

## 2022-04-10 DIAGNOSIS — R030 Elevated blood-pressure reading, without diagnosis of hypertension: Secondary | ICD-10-CM | POA: Diagnosis not present

## 2022-04-10 DIAGNOSIS — Z20828 Contact with and (suspected) exposure to other viral communicable diseases: Secondary | ICD-10-CM | POA: Diagnosis not present

## 2022-05-10 DIAGNOSIS — M545 Low back pain, unspecified: Secondary | ICD-10-CM | POA: Diagnosis not present

## 2022-05-10 DIAGNOSIS — Z299 Encounter for prophylactic measures, unspecified: Secondary | ICD-10-CM | POA: Diagnosis not present

## 2022-05-10 DIAGNOSIS — Z6830 Body mass index (BMI) 30.0-30.9, adult: Secondary | ICD-10-CM | POA: Diagnosis not present

## 2022-05-10 DIAGNOSIS — M4802 Spinal stenosis, cervical region: Secondary | ICD-10-CM | POA: Diagnosis not present

## 2022-05-10 DIAGNOSIS — R200 Anesthesia of skin: Secondary | ICD-10-CM | POA: Diagnosis not present

## 2022-05-10 DIAGNOSIS — G629 Polyneuropathy, unspecified: Secondary | ICD-10-CM | POA: Diagnosis not present

## 2022-05-10 DIAGNOSIS — M542 Cervicalgia: Secondary | ICD-10-CM | POA: Diagnosis not present

## 2022-05-10 DIAGNOSIS — M47816 Spondylosis without myelopathy or radiculopathy, lumbar region: Secondary | ICD-10-CM | POA: Diagnosis not present

## 2022-05-10 DIAGNOSIS — F419 Anxiety disorder, unspecified: Secondary | ICD-10-CM | POA: Diagnosis not present

## 2022-05-10 DIAGNOSIS — I700 Atherosclerosis of aorta: Secondary | ICD-10-CM | POA: Diagnosis not present

## 2022-05-10 DIAGNOSIS — M47817 Spondylosis without myelopathy or radiculopathy, lumbosacral region: Secondary | ICD-10-CM | POA: Diagnosis not present

## 2022-05-10 DIAGNOSIS — Z789 Other specified health status: Secondary | ICD-10-CM | POA: Diagnosis not present

## 2022-05-10 DIAGNOSIS — M4316 Spondylolisthesis, lumbar region: Secondary | ICD-10-CM | POA: Diagnosis not present

## 2022-06-08 DIAGNOSIS — Z23 Encounter for immunization: Secondary | ICD-10-CM | POA: Diagnosis not present

## 2022-10-10 DIAGNOSIS — N6323 Unspecified lump in the left breast, lower outer quadrant: Secondary | ICD-10-CM | POA: Diagnosis not present

## 2022-10-10 DIAGNOSIS — N6325 Unspecified lump in the left breast, overlapping quadrants: Secondary | ICD-10-CM | POA: Diagnosis not present

## 2022-10-10 DIAGNOSIS — N644 Mastodynia: Secondary | ICD-10-CM | POA: Diagnosis not present

## 2022-10-10 DIAGNOSIS — N6459 Other signs and symptoms in breast: Secondary | ICD-10-CM | POA: Diagnosis not present

## 2022-10-10 DIAGNOSIS — Z853 Personal history of malignant neoplasm of breast: Secondary | ICD-10-CM | POA: Diagnosis not present

## 2022-10-10 DIAGNOSIS — R928 Other abnormal and inconclusive findings on diagnostic imaging of breast: Secondary | ICD-10-CM | POA: Diagnosis not present

## 2022-10-15 DIAGNOSIS — R030 Elevated blood-pressure reading, without diagnosis of hypertension: Secondary | ICD-10-CM | POA: Diagnosis not present

## 2022-10-15 DIAGNOSIS — R0781 Pleurodynia: Secondary | ICD-10-CM | POA: Diagnosis not present

## 2022-10-15 DIAGNOSIS — Z6831 Body mass index (BMI) 31.0-31.9, adult: Secondary | ICD-10-CM | POA: Diagnosis not present

## 2022-10-24 DIAGNOSIS — N6323 Unspecified lump in the left breast, lower outer quadrant: Secondary | ICD-10-CM | POA: Diagnosis not present

## 2022-10-24 DIAGNOSIS — C50512 Malignant neoplasm of lower-outer quadrant of left female breast: Secondary | ICD-10-CM | POA: Diagnosis not present

## 2022-10-31 DIAGNOSIS — L719 Rosacea, unspecified: Secondary | ICD-10-CM | POA: Diagnosis not present

## 2022-10-31 DIAGNOSIS — B009 Herpesviral infection, unspecified: Secondary | ICD-10-CM | POA: Diagnosis not present

## 2022-11-06 DIAGNOSIS — Z853 Personal history of malignant neoplasm of breast: Secondary | ICD-10-CM | POA: Diagnosis not present

## 2022-11-06 DIAGNOSIS — Z170 Estrogen receptor positive status [ER+]: Secondary | ICD-10-CM | POA: Diagnosis not present

## 2022-11-06 DIAGNOSIS — C50912 Malignant neoplasm of unspecified site of left female breast: Secondary | ICD-10-CM | POA: Diagnosis not present

## 2022-11-08 DIAGNOSIS — Z170 Estrogen receptor positive status [ER+]: Secondary | ICD-10-CM | POA: Diagnosis not present

## 2022-11-08 DIAGNOSIS — C50912 Malignant neoplasm of unspecified site of left female breast: Secondary | ICD-10-CM | POA: Diagnosis not present

## 2022-11-08 DIAGNOSIS — C50312 Malignant neoplasm of lower-inner quadrant of left female breast: Secondary | ICD-10-CM | POA: Diagnosis not present

## 2022-11-12 DIAGNOSIS — R030 Elevated blood-pressure reading, without diagnosis of hypertension: Secondary | ICD-10-CM | POA: Diagnosis not present

## 2022-11-12 DIAGNOSIS — Z6832 Body mass index (BMI) 32.0-32.9, adult: Secondary | ICD-10-CM | POA: Diagnosis not present

## 2022-11-12 DIAGNOSIS — Z170 Estrogen receptor positive status [ER+]: Secondary | ICD-10-CM | POA: Diagnosis not present

## 2022-11-12 DIAGNOSIS — C50912 Malignant neoplasm of unspecified site of left female breast: Secondary | ICD-10-CM | POA: Diagnosis not present

## 2022-11-12 DIAGNOSIS — G629 Polyneuropathy, unspecified: Secondary | ICD-10-CM | POA: Diagnosis not present

## 2022-11-13 DIAGNOSIS — Z0001 Encounter for general adult medical examination with abnormal findings: Secondary | ICD-10-CM | POA: Diagnosis not present

## 2022-11-13 DIAGNOSIS — E039 Hypothyroidism, unspecified: Secondary | ICD-10-CM | POA: Diagnosis not present

## 2022-11-13 DIAGNOSIS — Z131 Encounter for screening for diabetes mellitus: Secondary | ICD-10-CM | POA: Diagnosis not present

## 2022-11-13 DIAGNOSIS — E782 Mixed hyperlipidemia: Secondary | ICD-10-CM | POA: Diagnosis not present

## 2022-11-13 DIAGNOSIS — E7849 Other hyperlipidemia: Secondary | ICD-10-CM | POA: Diagnosis not present

## 2022-11-13 DIAGNOSIS — R739 Hyperglycemia, unspecified: Secondary | ICD-10-CM | POA: Diagnosis not present

## 2022-11-13 DIAGNOSIS — Z1329 Encounter for screening for other suspected endocrine disorder: Secondary | ICD-10-CM | POA: Diagnosis not present

## 2022-11-13 DIAGNOSIS — Z1159 Encounter for screening for other viral diseases: Secondary | ICD-10-CM | POA: Diagnosis not present

## 2022-11-13 DIAGNOSIS — K2100 Gastro-esophageal reflux disease with esophagitis, without bleeding: Secondary | ICD-10-CM | POA: Diagnosis not present

## 2022-11-13 DIAGNOSIS — Z1321 Encounter for screening for nutritional disorder: Secondary | ICD-10-CM | POA: Diagnosis not present

## 2022-11-19 DIAGNOSIS — C50312 Malignant neoplasm of lower-inner quadrant of left female breast: Secondary | ICD-10-CM | POA: Diagnosis not present

## 2022-11-19 DIAGNOSIS — Z9889 Other specified postprocedural states: Secondary | ICD-10-CM | POA: Diagnosis not present

## 2022-11-19 DIAGNOSIS — Z170 Estrogen receptor positive status [ER+]: Secondary | ICD-10-CM | POA: Diagnosis not present

## 2022-11-19 DIAGNOSIS — C50912 Malignant neoplasm of unspecified site of left female breast: Secondary | ICD-10-CM | POA: Diagnosis not present

## 2022-11-21 DIAGNOSIS — Z170 Estrogen receptor positive status [ER+]: Secondary | ICD-10-CM | POA: Diagnosis not present

## 2022-11-21 DIAGNOSIS — C50912 Malignant neoplasm of unspecified site of left female breast: Secondary | ICD-10-CM | POA: Diagnosis not present

## 2022-12-04 DIAGNOSIS — Z170 Estrogen receptor positive status [ER+]: Secondary | ICD-10-CM | POA: Diagnosis not present

## 2022-12-04 DIAGNOSIS — C50912 Malignant neoplasm of unspecified site of left female breast: Secondary | ICD-10-CM | POA: Diagnosis not present

## 2022-12-05 DIAGNOSIS — Z923 Personal history of irradiation: Secondary | ICD-10-CM | POA: Diagnosis not present

## 2022-12-05 DIAGNOSIS — C50912 Malignant neoplasm of unspecified site of left female breast: Secondary | ICD-10-CM | POA: Diagnosis not present

## 2022-12-05 DIAGNOSIS — Z780 Asymptomatic menopausal state: Secondary | ICD-10-CM | POA: Diagnosis not present

## 2022-12-05 DIAGNOSIS — Z170 Estrogen receptor positive status [ER+]: Secondary | ICD-10-CM | POA: Diagnosis not present

## 2022-12-05 DIAGNOSIS — R1902 Left upper quadrant abdominal swelling, mass and lump: Secondary | ICD-10-CM | POA: Diagnosis not present

## 2022-12-05 DIAGNOSIS — C50512 Malignant neoplasm of lower-outer quadrant of left female breast: Secondary | ICD-10-CM | POA: Diagnosis not present

## 2023-01-02 DIAGNOSIS — C50312 Malignant neoplasm of lower-inner quadrant of left female breast: Secondary | ICD-10-CM | POA: Diagnosis not present

## 2023-01-02 DIAGNOSIS — Z780 Asymptomatic menopausal state: Secondary | ICD-10-CM | POA: Diagnosis not present

## 2023-01-02 DIAGNOSIS — I2510 Atherosclerotic heart disease of native coronary artery without angina pectoris: Secondary | ICD-10-CM | POA: Diagnosis not present

## 2023-01-02 DIAGNOSIS — M810 Age-related osteoporosis without current pathological fracture: Secondary | ICD-10-CM | POA: Diagnosis not present

## 2023-01-02 DIAGNOSIS — Z79811 Long term (current) use of aromatase inhibitors: Secondary | ICD-10-CM | POA: Diagnosis not present

## 2023-01-02 DIAGNOSIS — Z90710 Acquired absence of both cervix and uterus: Secondary | ICD-10-CM | POA: Diagnosis not present

## 2023-01-02 DIAGNOSIS — Z170 Estrogen receptor positive status [ER+]: Secondary | ICD-10-CM | POA: Diagnosis not present

## 2023-01-02 DIAGNOSIS — C50912 Malignant neoplasm of unspecified site of left female breast: Secondary | ICD-10-CM | POA: Diagnosis not present

## 2023-01-02 DIAGNOSIS — Z923 Personal history of irradiation: Secondary | ICD-10-CM | POA: Diagnosis not present

## 2023-01-04 DIAGNOSIS — C50912 Malignant neoplasm of unspecified site of left female breast: Secondary | ICD-10-CM | POA: Diagnosis not present

## 2023-01-04 DIAGNOSIS — Z0189 Encounter for other specified special examinations: Secondary | ICD-10-CM | POA: Diagnosis not present

## 2023-01-04 DIAGNOSIS — Z79811 Long term (current) use of aromatase inhibitors: Secondary | ICD-10-CM | POA: Diagnosis not present

## 2023-01-04 DIAGNOSIS — Z170 Estrogen receptor positive status [ER+]: Secondary | ICD-10-CM | POA: Diagnosis not present

## 2023-01-04 DIAGNOSIS — M810 Age-related osteoporosis without current pathological fracture: Secondary | ICD-10-CM | POA: Diagnosis not present

## 2023-01-16 DIAGNOSIS — Z923 Personal history of irradiation: Secondary | ICD-10-CM | POA: Diagnosis not present

## 2023-01-16 DIAGNOSIS — I2510 Atherosclerotic heart disease of native coronary artery without angina pectoris: Secondary | ICD-10-CM | POA: Diagnosis not present

## 2023-01-16 DIAGNOSIS — Z780 Asymptomatic menopausal state: Secondary | ICD-10-CM | POA: Diagnosis not present

## 2023-01-16 DIAGNOSIS — Z170 Estrogen receptor positive status [ER+]: Secondary | ICD-10-CM | POA: Diagnosis not present

## 2023-01-16 DIAGNOSIS — C50312 Malignant neoplasm of lower-inner quadrant of left female breast: Secondary | ICD-10-CM | POA: Diagnosis not present

## 2023-01-16 DIAGNOSIS — Z90710 Acquired absence of both cervix and uterus: Secondary | ICD-10-CM | POA: Diagnosis not present

## 2023-04-04 DIAGNOSIS — C50312 Malignant neoplasm of lower-inner quadrant of left female breast: Secondary | ICD-10-CM | POA: Diagnosis not present

## 2023-04-04 DIAGNOSIS — Z6831 Body mass index (BMI) 31.0-31.9, adult: Secondary | ICD-10-CM | POA: Diagnosis not present

## 2023-04-04 DIAGNOSIS — C50919 Malignant neoplasm of unspecified site of unspecified female breast: Secondary | ICD-10-CM | POA: Diagnosis not present

## 2023-04-04 DIAGNOSIS — Z170 Estrogen receptor positive status [ER+]: Secondary | ICD-10-CM | POA: Diagnosis not present

## 2023-04-04 DIAGNOSIS — I10 Essential (primary) hypertension: Secondary | ICD-10-CM | POA: Diagnosis not present

## 2023-04-04 DIAGNOSIS — M810 Age-related osteoporosis without current pathological fracture: Secondary | ICD-10-CM | POA: Diagnosis not present

## 2023-04-04 DIAGNOSIS — C50912 Malignant neoplasm of unspecified site of left female breast: Secondary | ICD-10-CM | POA: Diagnosis not present

## 2023-04-18 DIAGNOSIS — I10 Essential (primary) hypertension: Secondary | ICD-10-CM | POA: Diagnosis not present

## 2023-04-30 DIAGNOSIS — L719 Rosacea, unspecified: Secondary | ICD-10-CM | POA: Diagnosis not present

## 2023-04-30 DIAGNOSIS — E039 Hypothyroidism, unspecified: Secondary | ICD-10-CM | POA: Diagnosis not present

## 2023-04-30 DIAGNOSIS — R030 Elevated blood-pressure reading, without diagnosis of hypertension: Secondary | ICD-10-CM | POA: Diagnosis not present

## 2023-04-30 DIAGNOSIS — B009 Herpesviral infection, unspecified: Secondary | ICD-10-CM | POA: Diagnosis not present

## 2023-04-30 DIAGNOSIS — G629 Polyneuropathy, unspecified: Secondary | ICD-10-CM | POA: Diagnosis not present

## 2023-04-30 DIAGNOSIS — Z6831 Body mass index (BMI) 31.0-31.9, adult: Secondary | ICD-10-CM | POA: Diagnosis not present

## 2023-06-07 DIAGNOSIS — Z23 Encounter for immunization: Secondary | ICD-10-CM | POA: Diagnosis not present

## 2023-06-20 DIAGNOSIS — Z170 Estrogen receptor positive status [ER+]: Secondary | ICD-10-CM | POA: Diagnosis not present

## 2023-06-20 DIAGNOSIS — C50912 Malignant neoplasm of unspecified site of left female breast: Secondary | ICD-10-CM | POA: Diagnosis not present

## 2023-06-21 DIAGNOSIS — Z23 Encounter for immunization: Secondary | ICD-10-CM | POA: Diagnosis not present

## 2023-07-02 DIAGNOSIS — H04123 Dry eye syndrome of bilateral lacrimal glands: Secondary | ICD-10-CM | POA: Diagnosis not present

## 2023-07-02 DIAGNOSIS — H18593 Other hereditary corneal dystrophies, bilateral: Secondary | ICD-10-CM | POA: Diagnosis not present

## 2023-07-10 DIAGNOSIS — R928 Other abnormal and inconclusive findings on diagnostic imaging of breast: Secondary | ICD-10-CM | POA: Diagnosis not present

## 2023-07-10 DIAGNOSIS — R92322 Mammographic fibroglandular density, left breast: Secondary | ICD-10-CM | POA: Diagnosis not present

## 2023-07-10 DIAGNOSIS — C50512 Malignant neoplasm of lower-outer quadrant of left female breast: Secondary | ICD-10-CM | POA: Diagnosis not present

## 2023-07-10 DIAGNOSIS — Z170 Estrogen receptor positive status [ER+]: Secondary | ICD-10-CM | POA: Diagnosis not present

## 2023-07-22 DIAGNOSIS — C50312 Malignant neoplasm of lower-inner quadrant of left female breast: Secondary | ICD-10-CM | POA: Diagnosis not present

## 2023-07-22 DIAGNOSIS — M810 Age-related osteoporosis without current pathological fracture: Secondary | ICD-10-CM | POA: Diagnosis not present

## 2023-07-22 DIAGNOSIS — C50912 Malignant neoplasm of unspecified site of left female breast: Secondary | ICD-10-CM | POA: Diagnosis not present

## 2023-07-22 DIAGNOSIS — Z170 Estrogen receptor positive status [ER+]: Secondary | ICD-10-CM | POA: Diagnosis not present

## 2023-07-23 DIAGNOSIS — Z170 Estrogen receptor positive status [ER+]: Secondary | ICD-10-CM | POA: Diagnosis not present

## 2023-07-23 DIAGNOSIS — C50912 Malignant neoplasm of unspecified site of left female breast: Secondary | ICD-10-CM | POA: Diagnosis not present

## 2023-08-10 DIAGNOSIS — R300 Dysuria: Secondary | ICD-10-CM | POA: Diagnosis not present

## 2023-08-10 DIAGNOSIS — N3090 Cystitis, unspecified without hematuria: Secondary | ICD-10-CM | POA: Diagnosis not present

## 2023-08-10 DIAGNOSIS — Z6831 Body mass index (BMI) 31.0-31.9, adult: Secondary | ICD-10-CM | POA: Diagnosis not present

## 2023-08-10 DIAGNOSIS — R030 Elevated blood-pressure reading, without diagnosis of hypertension: Secondary | ICD-10-CM | POA: Diagnosis not present

## 2023-09-04 DIAGNOSIS — R030 Elevated blood-pressure reading, without diagnosis of hypertension: Secondary | ICD-10-CM | POA: Diagnosis not present

## 2023-09-04 DIAGNOSIS — Z6831 Body mass index (BMI) 31.0-31.9, adult: Secondary | ICD-10-CM | POA: Diagnosis not present

## 2023-09-04 DIAGNOSIS — N760 Acute vaginitis: Secondary | ICD-10-CM | POA: Diagnosis not present

## 2023-09-04 DIAGNOSIS — R300 Dysuria: Secondary | ICD-10-CM | POA: Diagnosis not present

## 2023-10-08 DIAGNOSIS — S29019D Strain of muscle and tendon of unspecified wall of thorax, subsequent encounter: Secondary | ICD-10-CM | POA: Diagnosis not present

## 2023-10-08 DIAGNOSIS — Z6831 Body mass index (BMI) 31.0-31.9, adult: Secondary | ICD-10-CM | POA: Diagnosis not present

## 2023-10-15 DIAGNOSIS — C50912 Malignant neoplasm of unspecified site of left female breast: Secondary | ICD-10-CM | POA: Diagnosis not present

## 2023-10-15 DIAGNOSIS — Z170 Estrogen receptor positive status [ER+]: Secondary | ICD-10-CM | POA: Diagnosis not present

## 2023-10-16 DIAGNOSIS — R928 Other abnormal and inconclusive findings on diagnostic imaging of breast: Secondary | ICD-10-CM | POA: Diagnosis not present

## 2023-10-16 DIAGNOSIS — Z853 Personal history of malignant neoplasm of breast: Secondary | ICD-10-CM | POA: Diagnosis not present

## 2023-10-16 DIAGNOSIS — R92323 Mammographic fibroglandular density, bilateral breasts: Secondary | ICD-10-CM | POA: Diagnosis not present

## 2023-10-24 DIAGNOSIS — M546 Pain in thoracic spine: Secondary | ICD-10-CM | POA: Diagnosis not present

## 2023-10-24 DIAGNOSIS — G8929 Other chronic pain: Secondary | ICD-10-CM | POA: Diagnosis not present

## 2023-10-24 DIAGNOSIS — C50912 Malignant neoplasm of unspecified site of left female breast: Secondary | ICD-10-CM | POA: Diagnosis not present

## 2023-10-24 DIAGNOSIS — Z170 Estrogen receptor positive status [ER+]: Secondary | ICD-10-CM | POA: Diagnosis not present

## 2023-10-30 DIAGNOSIS — B009 Herpesviral infection, unspecified: Secondary | ICD-10-CM | POA: Diagnosis not present

## 2023-10-30 DIAGNOSIS — L719 Rosacea, unspecified: Secondary | ICD-10-CM | POA: Diagnosis not present

## 2023-11-14 DIAGNOSIS — Z853 Personal history of malignant neoplasm of breast: Secondary | ICD-10-CM | POA: Diagnosis not present

## 2023-11-14 DIAGNOSIS — Z170 Estrogen receptor positive status [ER+]: Secondary | ICD-10-CM | POA: Diagnosis not present

## 2023-11-14 DIAGNOSIS — C50912 Malignant neoplasm of unspecified site of left female breast: Secondary | ICD-10-CM | POA: Diagnosis not present

## 2023-11-29 DIAGNOSIS — M5412 Radiculopathy, cervical region: Secondary | ICD-10-CM | POA: Diagnosis not present

## 2023-11-29 DIAGNOSIS — Z6830 Body mass index (BMI) 30.0-30.9, adult: Secondary | ICD-10-CM | POA: Diagnosis not present

## 2023-11-29 DIAGNOSIS — M7552 Bursitis of left shoulder: Secondary | ICD-10-CM | POA: Diagnosis not present

## 2023-11-29 DIAGNOSIS — M19041 Primary osteoarthritis, right hand: Secondary | ICD-10-CM | POA: Diagnosis not present

## 2023-11-29 DIAGNOSIS — M19042 Primary osteoarthritis, left hand: Secondary | ICD-10-CM | POA: Diagnosis not present

## 2024-01-06 DIAGNOSIS — Z170 Estrogen receptor positive status [ER+]: Secondary | ICD-10-CM | POA: Diagnosis not present

## 2024-01-06 DIAGNOSIS — C50912 Malignant neoplasm of unspecified site of left female breast: Secondary | ICD-10-CM | POA: Diagnosis not present

## 2024-01-07 DIAGNOSIS — C50912 Malignant neoplasm of unspecified site of left female breast: Secondary | ICD-10-CM | POA: Diagnosis not present

## 2024-01-07 DIAGNOSIS — Z170 Estrogen receptor positive status [ER+]: Secondary | ICD-10-CM | POA: Diagnosis not present

## 2024-01-13 DIAGNOSIS — M19012 Primary osteoarthritis, left shoulder: Secondary | ICD-10-CM | POA: Diagnosis not present

## 2024-01-13 DIAGNOSIS — S43432A Superior glenoid labrum lesion of left shoulder, initial encounter: Secondary | ICD-10-CM | POA: Diagnosis not present

## 2024-01-13 DIAGNOSIS — M7582 Other shoulder lesions, left shoulder: Secondary | ICD-10-CM | POA: Diagnosis not present

## 2024-01-13 DIAGNOSIS — M7522 Bicipital tendinitis, left shoulder: Secondary | ICD-10-CM | POA: Diagnosis not present

## 2024-01-13 DIAGNOSIS — M67812 Other specified disorders of synovium, left shoulder: Secondary | ICD-10-CM | POA: Diagnosis not present

## 2024-01-13 DIAGNOSIS — M25412 Effusion, left shoulder: Secondary | ICD-10-CM | POA: Diagnosis not present

## 2024-01-13 DIAGNOSIS — M7552 Bursitis of left shoulder: Secondary | ICD-10-CM | POA: Diagnosis not present

## 2024-02-04 DIAGNOSIS — R519 Headache, unspecified: Secondary | ICD-10-CM | POA: Diagnosis not present

## 2024-02-04 DIAGNOSIS — R002 Palpitations: Secondary | ICD-10-CM | POA: Diagnosis not present

## 2024-02-04 DIAGNOSIS — R030 Elevated blood-pressure reading, without diagnosis of hypertension: Secondary | ICD-10-CM | POA: Diagnosis not present

## 2024-02-04 DIAGNOSIS — Z6831 Body mass index (BMI) 31.0-31.9, adult: Secondary | ICD-10-CM | POA: Diagnosis not present

## 2024-02-05 DIAGNOSIS — Z961 Presence of intraocular lens: Secondary | ICD-10-CM | POA: Diagnosis not present

## 2024-02-05 DIAGNOSIS — H18593 Other hereditary corneal dystrophies, bilateral: Secondary | ICD-10-CM | POA: Diagnosis not present

## 2024-02-05 DIAGNOSIS — H04123 Dry eye syndrome of bilateral lacrimal glands: Secondary | ICD-10-CM | POA: Diagnosis not present

## 2024-02-25 DIAGNOSIS — I10 Essential (primary) hypertension: Secondary | ICD-10-CM | POA: Diagnosis not present

## 2024-03-06 DIAGNOSIS — Z6830 Body mass index (BMI) 30.0-30.9, adult: Secondary | ICD-10-CM | POA: Diagnosis not present

## 2024-03-06 DIAGNOSIS — R21 Rash and other nonspecific skin eruption: Secondary | ICD-10-CM | POA: Diagnosis not present

## 2024-04-28 DIAGNOSIS — M19042 Primary osteoarthritis, left hand: Secondary | ICD-10-CM | POA: Diagnosis not present

## 2024-04-28 DIAGNOSIS — M19041 Primary osteoarthritis, right hand: Secondary | ICD-10-CM | POA: Diagnosis not present

## 2024-04-28 DIAGNOSIS — Z6831 Body mass index (BMI) 31.0-31.9, adult: Secondary | ICD-10-CM | POA: Diagnosis not present

## 2024-05-04 DIAGNOSIS — B009 Herpesviral infection, unspecified: Secondary | ICD-10-CM | POA: Diagnosis not present

## 2024-05-04 DIAGNOSIS — D1801 Hemangioma of skin and subcutaneous tissue: Secondary | ICD-10-CM | POA: Diagnosis not present

## 2024-05-04 DIAGNOSIS — L719 Rosacea, unspecified: Secondary | ICD-10-CM | POA: Diagnosis not present

## 2024-05-11 DIAGNOSIS — M810 Age-related osteoporosis without current pathological fracture: Secondary | ICD-10-CM | POA: Diagnosis not present

## 2024-05-11 DIAGNOSIS — C50912 Malignant neoplasm of unspecified site of left female breast: Secondary | ICD-10-CM | POA: Diagnosis not present

## 2024-05-11 DIAGNOSIS — Z170 Estrogen receptor positive status [ER+]: Secondary | ICD-10-CM | POA: Diagnosis not present

## 2024-05-11 DIAGNOSIS — Z79811 Long term (current) use of aromatase inhibitors: Secondary | ICD-10-CM | POA: Diagnosis not present

## 2024-05-11 DIAGNOSIS — C50312 Malignant neoplasm of lower-inner quadrant of left female breast: Secondary | ICD-10-CM | POA: Diagnosis not present

## 2024-05-12 DIAGNOSIS — Z170 Estrogen receptor positive status [ER+]: Secondary | ICD-10-CM | POA: Diagnosis not present

## 2024-05-12 DIAGNOSIS — Z853 Personal history of malignant neoplasm of breast: Secondary | ICD-10-CM | POA: Diagnosis not present

## 2024-05-12 DIAGNOSIS — C50912 Malignant neoplasm of unspecified site of left female breast: Secondary | ICD-10-CM | POA: Diagnosis not present

## 2024-07-13 DIAGNOSIS — C50312 Malignant neoplasm of lower-inner quadrant of left female breast: Secondary | ICD-10-CM | POA: Diagnosis not present

## 2024-07-13 DIAGNOSIS — Z170 Estrogen receptor positive status [ER+]: Secondary | ICD-10-CM | POA: Diagnosis not present

## 2024-07-13 DIAGNOSIS — C50912 Malignant neoplasm of unspecified site of left female breast: Secondary | ICD-10-CM | POA: Diagnosis not present

## 2024-07-14 DIAGNOSIS — C50912 Malignant neoplasm of unspecified site of left female breast: Secondary | ICD-10-CM | POA: Diagnosis not present

## 2024-07-14 DIAGNOSIS — Z170 Estrogen receptor positive status [ER+]: Secondary | ICD-10-CM | POA: Diagnosis not present
